# Patient Record
Sex: Female | Born: 1990 | Race: White | Hispanic: Yes | Marital: Single | Smoking: Never smoker
Health system: Southern US, Community
[De-identification: ages and names within clinical notes are randomized; demographics above are authoritative.]

## PROBLEM LIST (undated history)

## (undated) DIAGNOSIS — R87629 Unspecified abnormal cytological findings in specimens from vagina: Secondary | ICD-10-CM

## (undated) DIAGNOSIS — D649 Anemia, unspecified: Secondary | ICD-10-CM

## (undated) HISTORY — DX: Unspecified abnormal cytological findings in specimens from vagina: R87.629

## (undated) HISTORY — DX: Anemia, unspecified: D64.9

---

## 2009-01-10 ENCOUNTER — Observation Stay: Payer: Self-pay

## 2009-03-27 ENCOUNTER — Inpatient Hospital Stay: Payer: Self-pay

## 2012-10-11 ENCOUNTER — Ambulatory Visit: Payer: Self-pay | Admitting: Advanced Practice Midwife

## 2012-10-31 ENCOUNTER — Ambulatory Visit: Payer: Self-pay | Admitting: Physician Assistant

## 2012-11-07 ENCOUNTER — Observation Stay: Payer: Self-pay | Admitting: Obstetrics and Gynecology

## 2012-11-08 ENCOUNTER — Inpatient Hospital Stay: Payer: Self-pay | Admitting: Obstetrics and Gynecology

## 2012-11-08 LAB — CBC WITH DIFFERENTIAL/PLATELET
Basophil %: 0.3 %
Eosinophil #: 0.1 10*3/uL (ref 0.0–0.7)
Eosinophil %: 0.7 %
HCT: 34 % — ABNORMAL LOW (ref 35.0–47.0)
HGB: 11.1 g/dL — ABNORMAL LOW (ref 12.0–16.0)
Lymphocyte #: 2.2 10*3/uL (ref 1.0–3.6)
Lymphocyte %: 19.9 %
MCH: 26.2 pg (ref 26.0–34.0)
MCV: 80 fL (ref 80–100)
Monocyte #: 0.8 x10 3/mm (ref 0.2–0.9)
Neutrophil %: 72.1 %
Platelet: 274 10*3/uL (ref 150–440)
RBC: 4.25 10*6/uL (ref 3.80–5.20)
RDW: 15.9 % — ABNORMAL HIGH (ref 11.5–14.5)
WBC: 10.8 10*3/uL (ref 3.6–11.0)

## 2012-11-08 LAB — GC/CHLAMYDIA PROBE AMP

## 2012-11-09 LAB — HEMATOCRIT: HCT: 29.3 % — ABNORMAL LOW (ref 35.0–47.0)

## 2014-08-23 IMAGING — US US OB US >=[ID] SNGL FETUS
1 series · 14 of 28 positions shown · non-contrast
Comparison: none

REASON FOR EXAM: 34 weeks  low risk anatomy
COMMENTS:

[Series 1: us ob us >=(id) sngl fetus · 0.25mm/px · 14 of 94 slices shown]
[im 4/94]
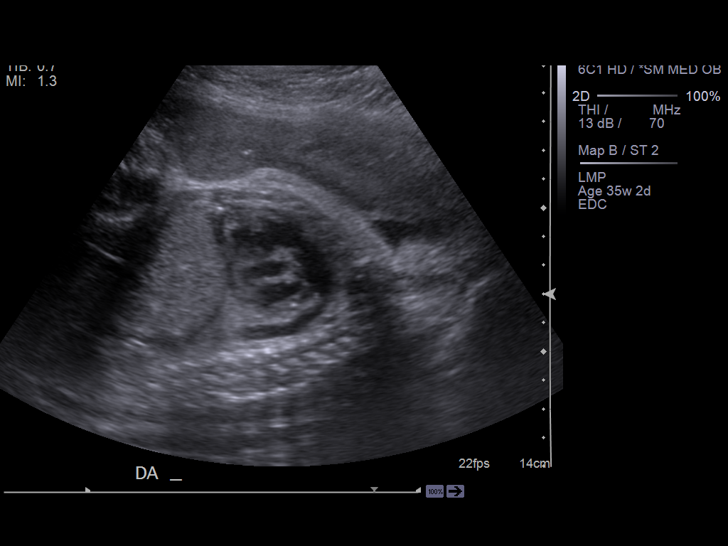
[im 11/94]
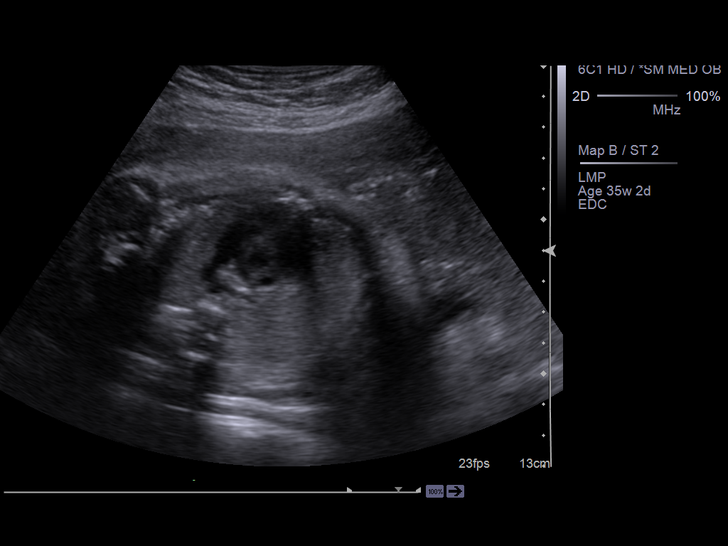
[im 18/94]
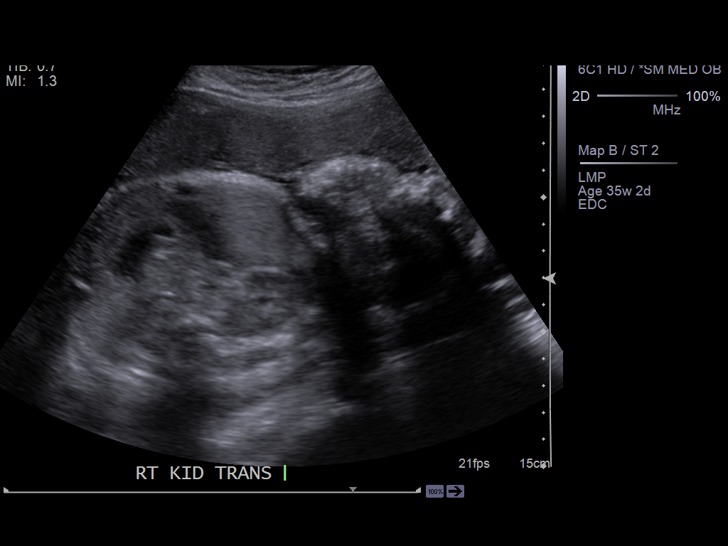
[im 25/94]
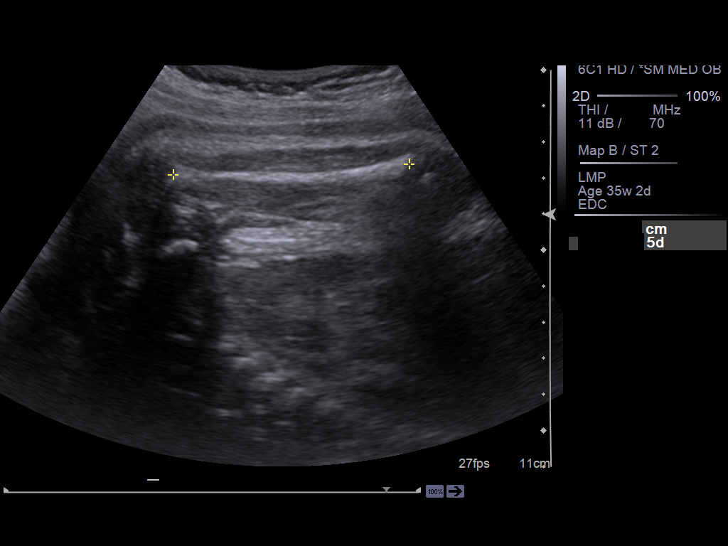
[im 32/94]
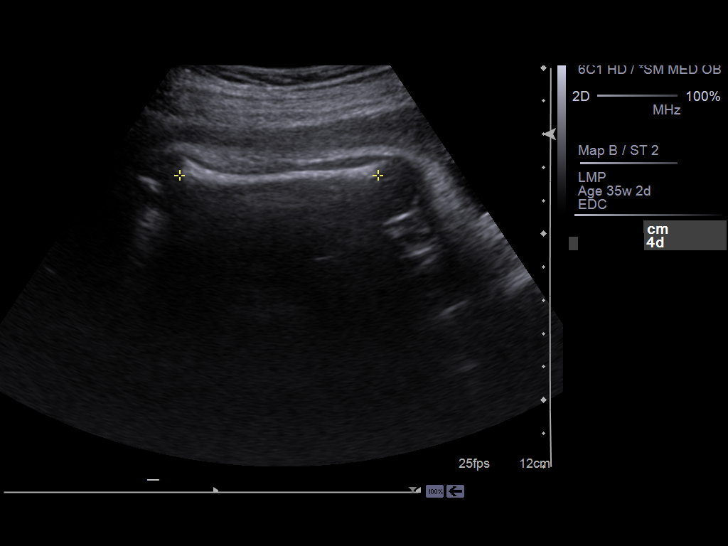
[im 38/94]
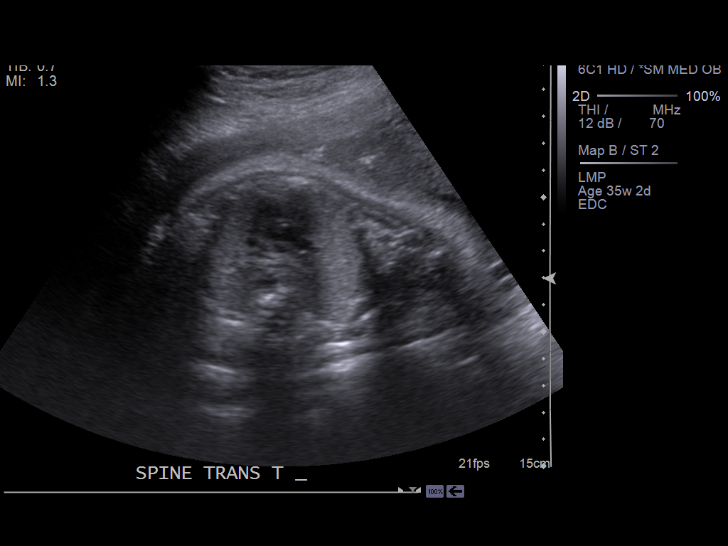
[im 45/94]
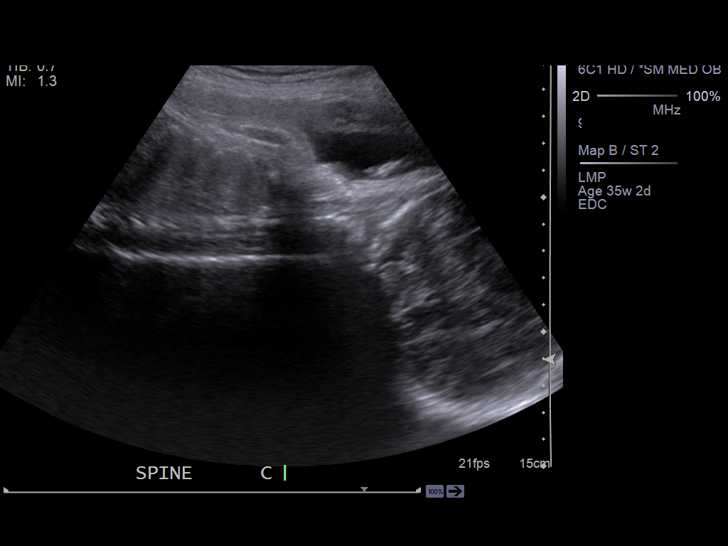
[im 52/94]
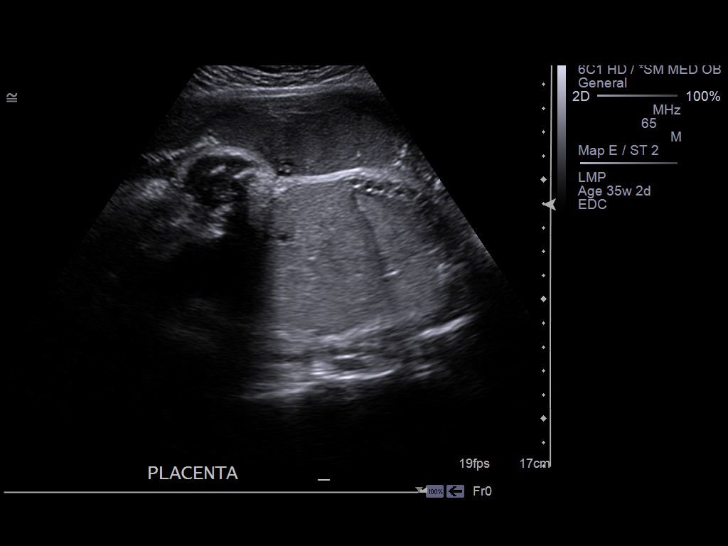
[im 59/94]
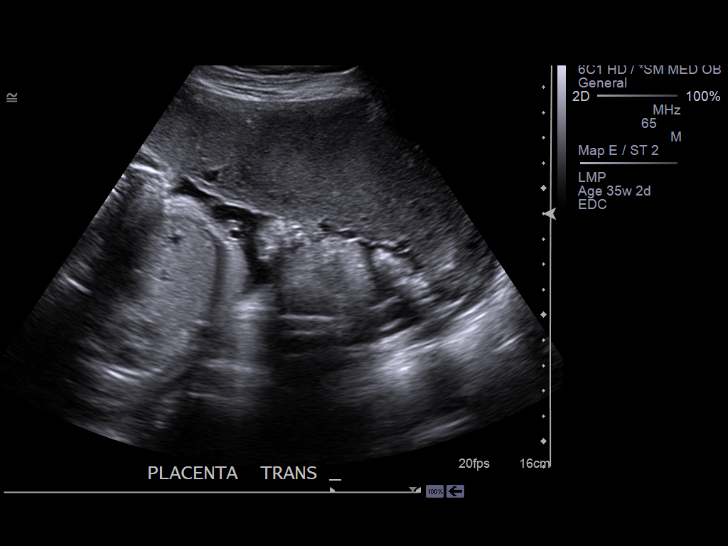
[im 66/94]
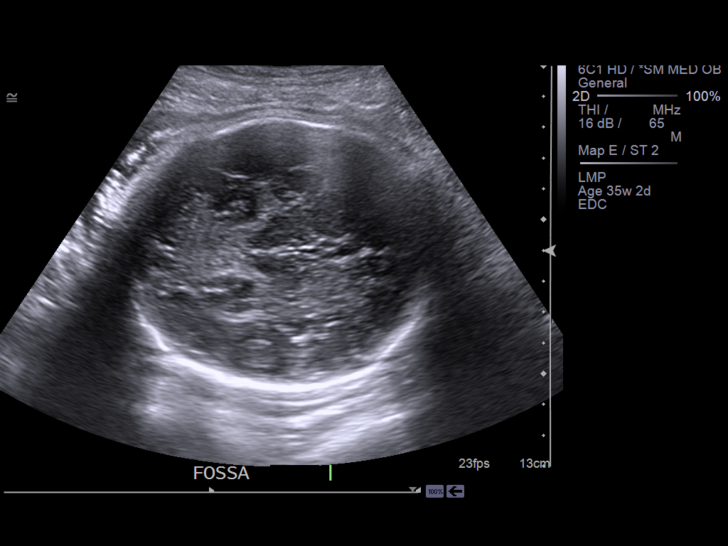
[im 73/94]
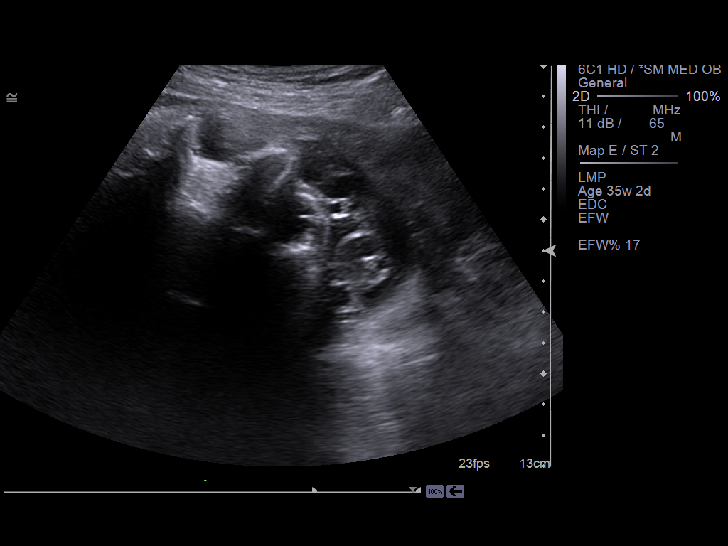
[im 80/94]
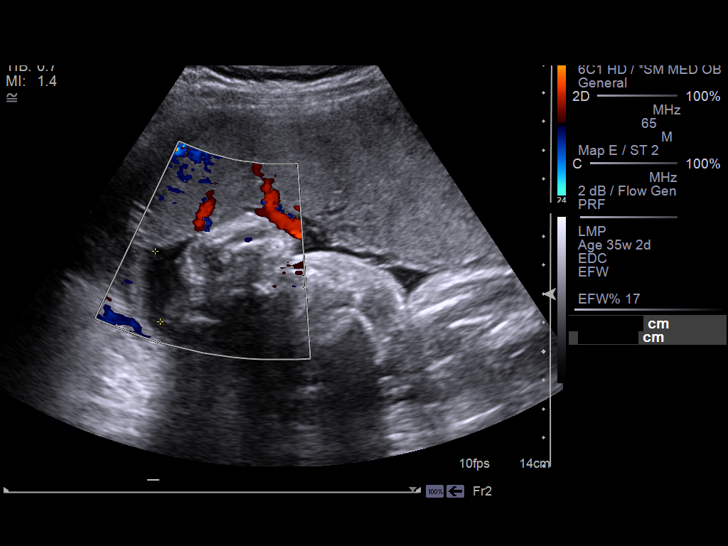
[im 87/94]
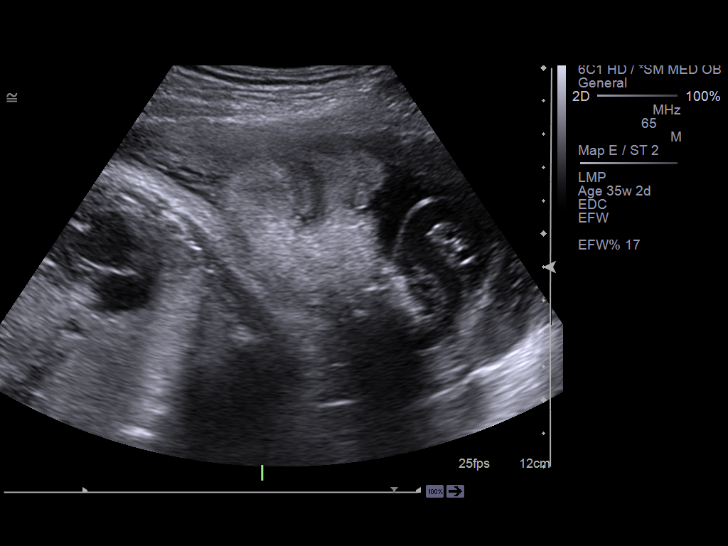
[im 94/94]
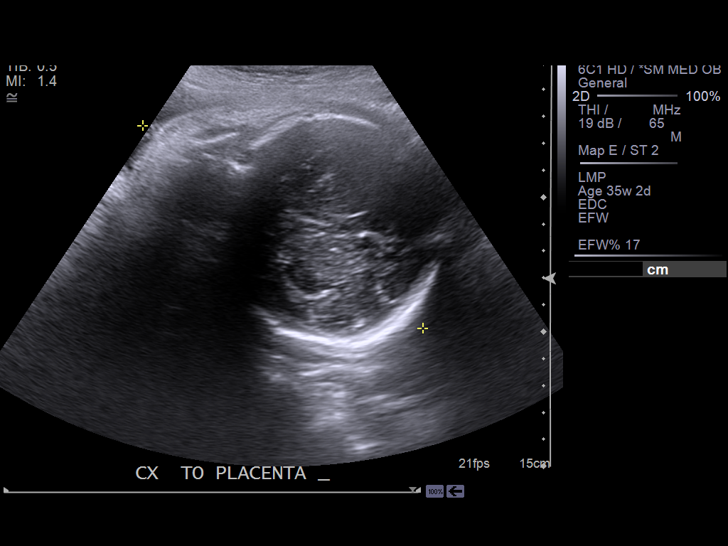

[14 of 28 positions shown; findings below may reference images not displayed]

PROCEDURE:     US  - US OB GREATER/OR EQUAL TO [id number]  - October 11, 2012  [DATE]

RESULT:     Findings: A single living intrauterine gestation is observed.
Presentation currently is cephalic. The cervix is closed measuring 3.56 in
length. The placenta is anterior measuring 12.85 cm from the cervical os.
The fetal heart rate was monitored at 140 beats per minute. The AFI measures
11.2.

A normal appearing three vessel cord is seen. No hydrocephalus or
hydronephrosis is noted.  The bladder, kidneys, stomach, heart, diaphragm,
spine and ventricles demonstrate no focal abnormality. A 4 chambered heart
is visualized. The RVOT and LVOT are suboptimally assessed.

Fetal measurements are as follows:

BPD 8.68 cm 35 weeks 0 days
HC   32.2 cm 36 weeks 3 days
AC   29.77 cm 33 weeks 5 days
FL    6.57 cm 33 weeks 6 days

EFW equal 2471 grams. Average ultrasound age is 34 weeks 5 days. Ultrasound
EDD is 11/17/2012.
IMPRESSION: See above.

[REDACTED]

## 2014-09-12 IMAGING — US US OB FOLLOW-UP
1 series · 14 of 28 positions shown · non-contrast
Comparison: none

REASON FOR EXAM: size less than dates
COMMENTS:

PROCEDURE:     US  - US OB FOLLOW UP  - October 31, 2012  [DATE]
RESULT:     Comparison: 10/11/2012
TECHNIQUE: Multiple grayscale and color Doppler images were obtained of the
pelvis.

[Series 1: us ob follow-up · 0.25mm/px · 14 of 49 slices shown]
[im 2/49]
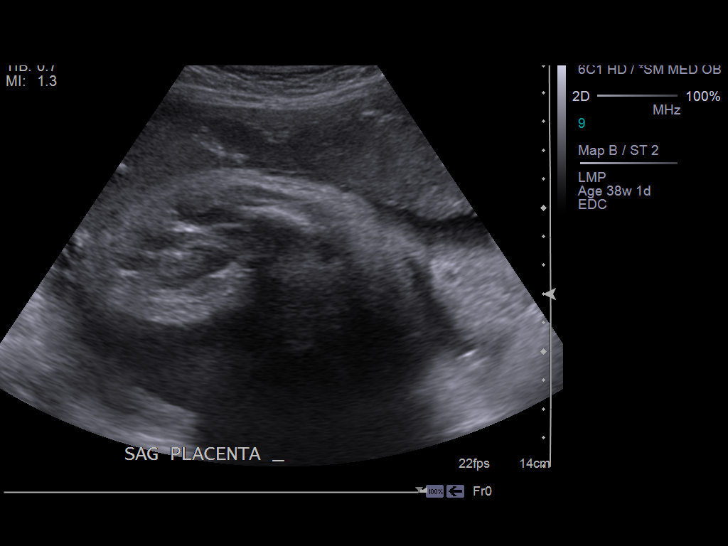
[im 6/49]
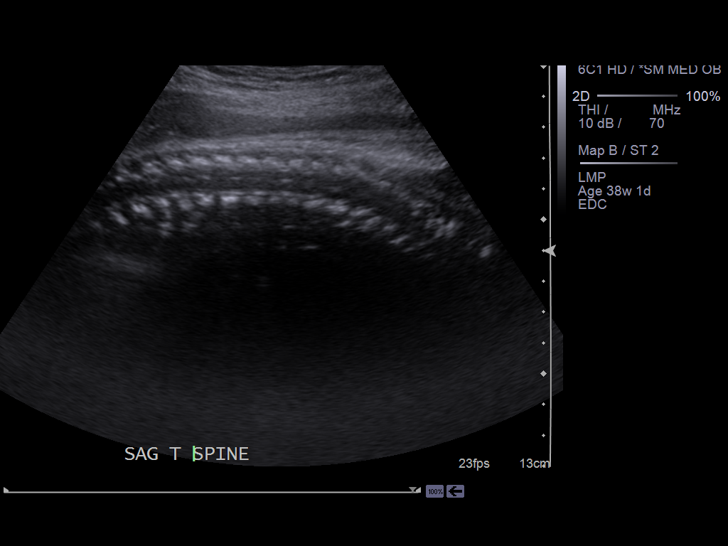
[im 9/49]
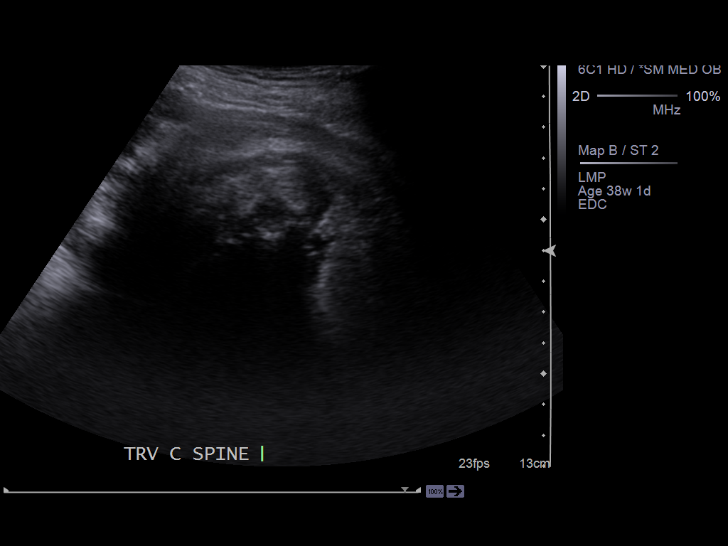
[im 13/49]
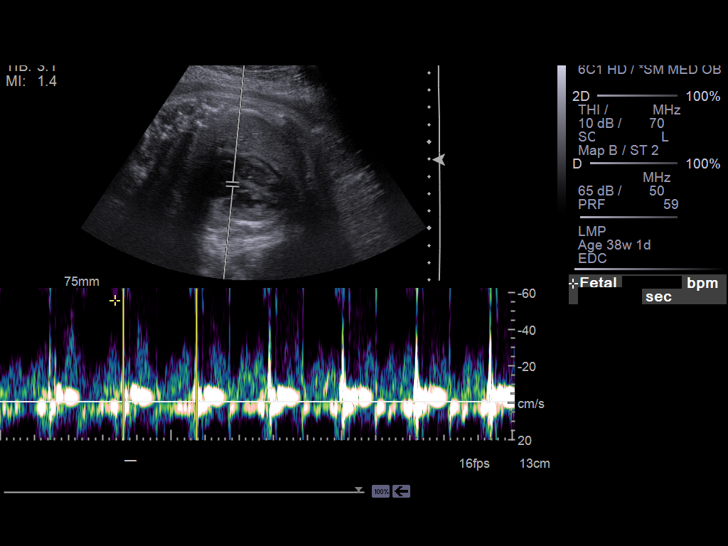
[im 17/49]
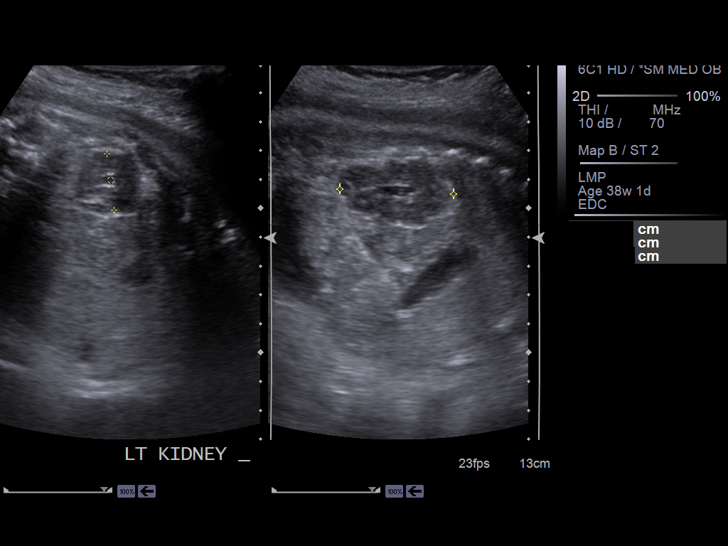
[im 20/49]
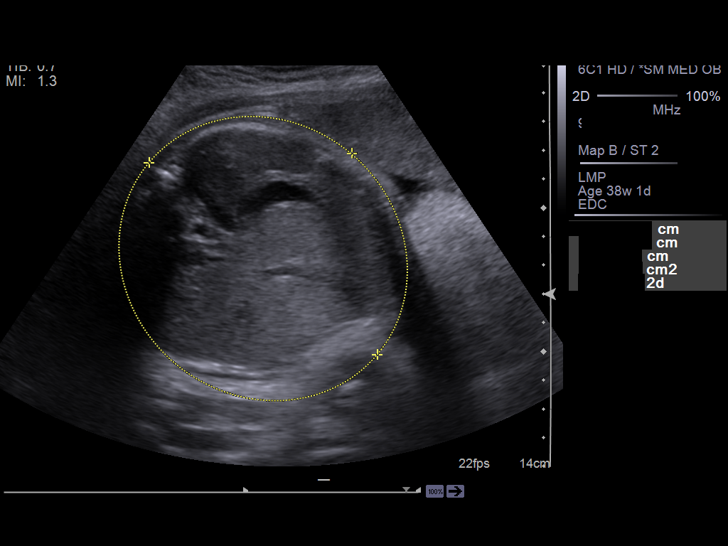
[im 24/49]
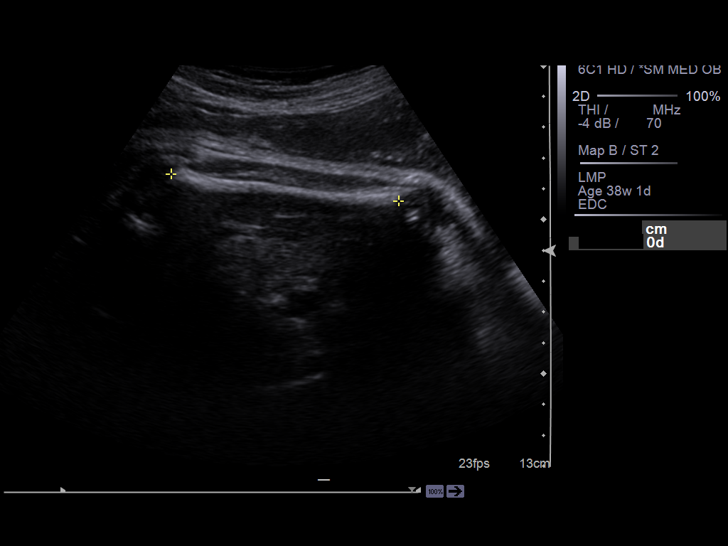
[im 27/49]
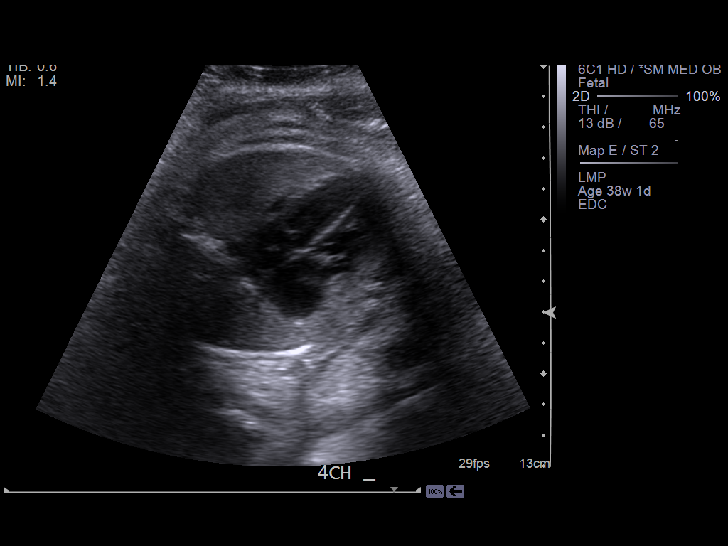
[im 31/49]
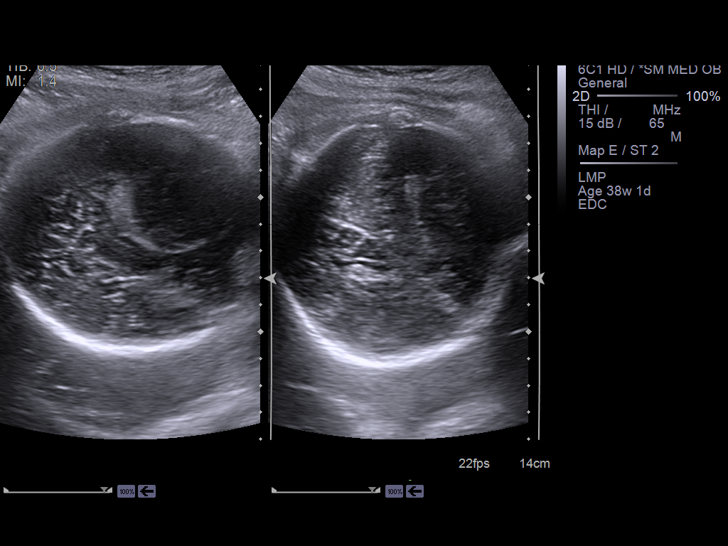
[im 34/49]
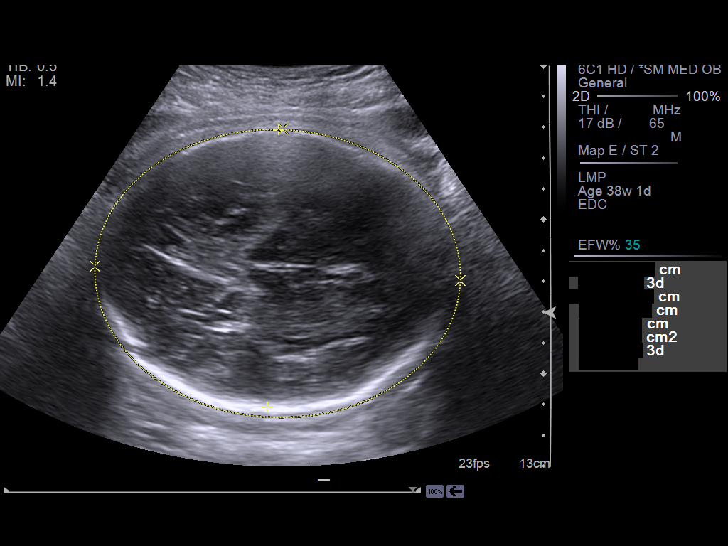
[im 38/49]
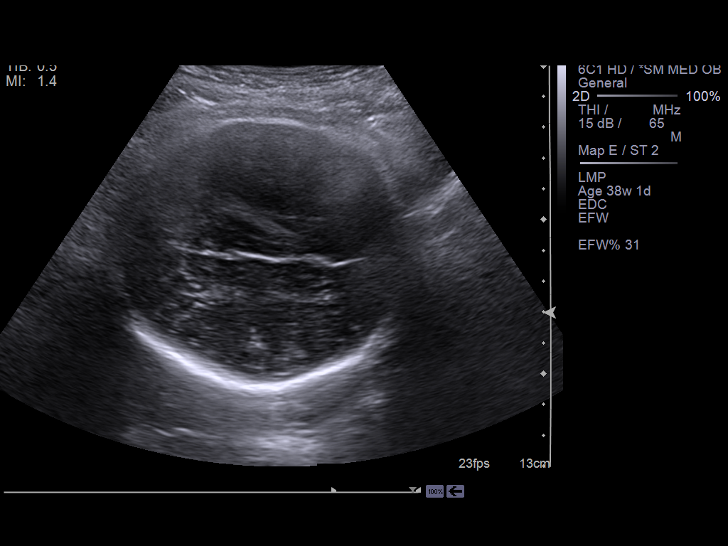
[im 41/49]
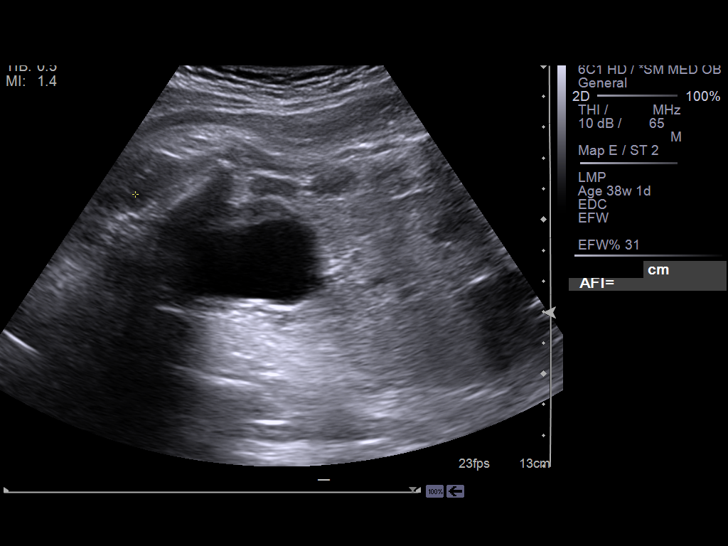
[im 45/49]
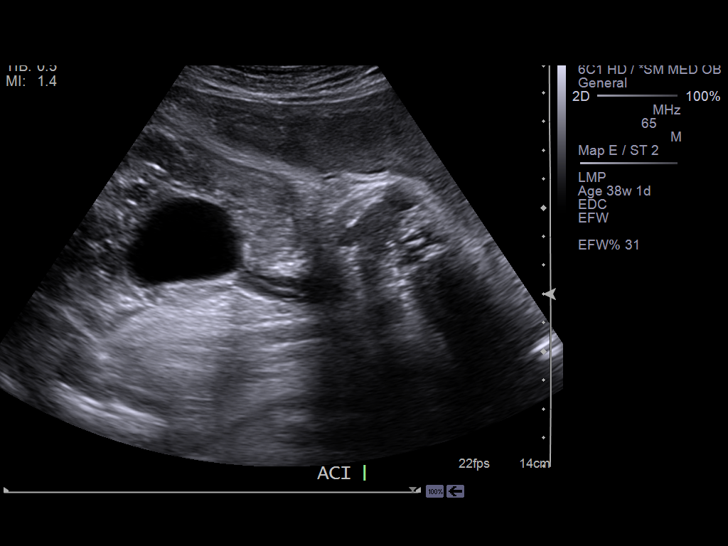
[im 49/49]
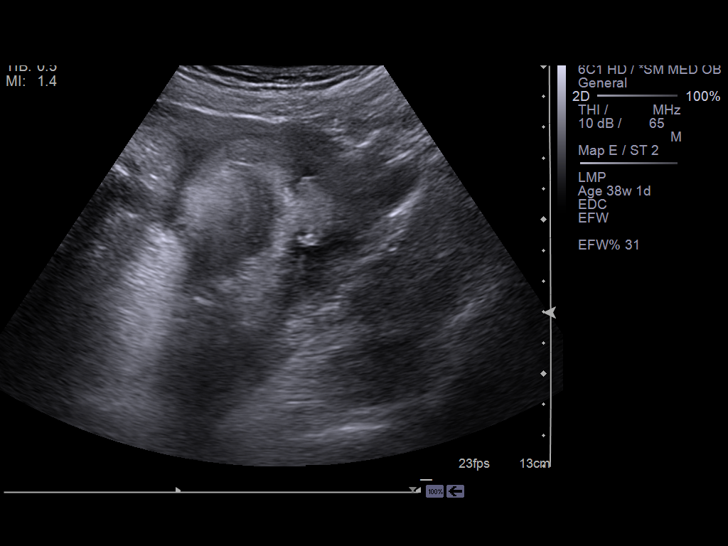

[14 of 28 positions shown; findings below may reference images not displayed]

FINDINGS: There is a single live intrauterine pregnancy. Fetal heart rate was 140
beats per minute. The fetus is currently in cephalic position. Estimated
gestational age is 36 weeks 6 days. This study is 2 weeks since the prior
ultrasound when the estimated gestational age was 34 weeks 5 days. Estimated
fetal weight is 6 pounds 12 ounces, which correlates with estimated fetal
weight percentage of 31%. The amniotic fluid index is 9.1 cm, which is
within normal limits. Please note, this is not a detailed fetal anatomic
study.
IMPRESSION: Single live intrauterine pregnancy with estimated gestational age of 36
weeks 6 days.

[REDACTED]

## 2019-05-02 NOTE — L&D Delivery Note (Signed)
Delivery Note At 6:25 AM a viable and healthy female "Meghan Reynolds" was delivered via Vaginal, Spontaneous (Presentation: Right Occiput Anterior).  APGAR: 8, 9; weight pending .   Placenta status: Spontaneous, Intact.  Cord: 3 vessels with the following complications: None.   Anesthesia: None Episiotomy: None Lacerations: None Est. Blood Loss (mL): 200  Mom to postpartum.  Baby to Couplet care / Skin to Skin.  29yo [id number] at 39+0wks presented inactive labor, delivered without pain meds easily over an intact perineum. No shoulder, baby placed on maternal abdomen. Trailing membranes as placenta delivered, and fundus firm. No manual evacuation of uterus, but no tearing and not bleeding. Speculum placed to evaluate cervix, which appeared hemostatic.   Christeen Douglas 01/07/2020, 7:13 AM

## 2019-11-14 ENCOUNTER — Ambulatory Visit: Payer: Self-pay | Admitting: Family Medicine

## 2019-11-14 ENCOUNTER — Other Ambulatory Visit: Payer: Self-pay | Admitting: Family Medicine

## 2019-11-14 ENCOUNTER — Encounter: Payer: Self-pay | Admitting: Family Medicine

## 2019-11-14 ENCOUNTER — Other Ambulatory Visit: Payer: Self-pay

## 2019-11-14 VITALS — BP 95/57 | HR 95 | Temp 97.9°F | Ht 67.0 in | Wt 167.6 lb

## 2019-11-14 DIAGNOSIS — Z3480 Encounter for supervision of other normal pregnancy, unspecified trimester: Secondary | ICD-10-CM | POA: Diagnosis not present

## 2019-11-14 DIAGNOSIS — K219 Gastro-esophageal reflux disease without esophagitis: Secondary | ICD-10-CM | POA: Insufficient documentation

## 2019-11-14 DIAGNOSIS — O0930 Supervision of pregnancy with insufficient antenatal care, unspecified trimester: Secondary | ICD-10-CM

## 2019-11-14 DIAGNOSIS — Z3483 Encounter for supervision of other normal pregnancy, third trimester: Secondary | ICD-10-CM

## 2019-11-14 HISTORY — DX: Gastro-esophageal reflux disease without esophagitis: K21.9

## 2019-11-14 LAB — URINALYSIS
Bilirubin, UA: NEGATIVE
Glucose, UA: NEGATIVE
Ketones, UA: NEGATIVE
Leukocytes,UA: NEGATIVE
Nitrite, UA: NEGATIVE
Protein,UA: NEGATIVE
RBC, UA: NEGATIVE
Specific Gravity, UA: 1.025 (ref 1.005–1.030)
Urobilinogen, Ur: 0.2 mg/dL (ref 0.2–1.0)
pH, UA: 6.5 (ref 5.0–7.5)

## 2019-11-14 LAB — HEMOGLOBIN, FINGERSTICK: Hemoglobin: 11.8 g/dL (ref 11.1–15.9)

## 2019-11-14 LAB — WET PREP FOR TRICH, YEAST, CLUE
Trichomonas Exam: NEGATIVE
Yeast Exam: NEGATIVE

## 2019-11-14 NOTE — Progress Notes (Signed)
Here today for 31.0 week MH IP. Transfer of Prenatal Care from [place]. Taking PNV QD. Denies ED/hospital visits since arrival to [place] 11/09/19. Tawny Hopping, RN

## 2019-11-14 NOTE — Progress Notes (Addendum)
[employer] results reviewed. Per standing orders no treatment indicated. PTL and Kick Count information included in today's education. Tawny Hopping, RN

## 2019-11-14 NOTE — Progress Notes (Signed)
[facility] HEALTH DEPT [facility] [address] [place] [address] [phone]  INITIAL PRENATAL VISIT NOTE  Subjective:  Meghan Reynolds is a 29 y.o. G3P2002 at [redacted]w[redacted]d being seen today to start prenatal care at the [facility] Department.  She is currently monitored for the following issues for this low-risk pregnancy and has Supervision of other normal pregnancy, antepartum; Transfer of OB care from [place] at 31 wks; and Gastroesophageal reflux disease on their problem list.  Pt presents for initial OB visit in the [place]. She received prenatal care in [place], brings along [place] reports (though no anatomy US report), no bloodwork results. EDD is based on [place] from Jan 27/2021 at 7 weeks. She is temporarily back in [place] to have her child, living with her parents, working part time on a car lot. Her partner remains in [place] with their other 2 children, ages 72 and 44. She is having heartburn but otherwise feeling well. Had some spotting ~1 mo ago in [place], advised to rest for a few days, no bleeding since. No other complications with this pregnancy. She denies chronic medical issues, does not drink, smoke or use drugs. Her last pap was in 2017, result normal. Her prior 2 pregnancies were uncomplicated: 03/2009 = SVD at [redacted]w[redacted]d, 6+ lbs. 10/2012 = SVD at [redacted]w[redacted]d, 7+ lbs. Denies HTN, GDM or other problems during past pregnancies. Denies family history of genetic abnormalities. PHQ-9 results: 3, states she feels very emotional during this pregnancy, declines beh health referral for now.    Contractions: Not present. Vag. Bleeding: None.  Movement: Present. Denies leaking of fluid.   Indications for ASA therapy (per uptodate) One of the following: Previous pregnancy with preeclampsia, especially early onset and with an adverse outcome No Multifetal gestation No Chronic hypertension No Type 1 or 2 diabetes mellitus No Chronic kidney disease No Autoimmune  disease (antiphospholipid syndrome, systemic lupus erythematosus) No  Two or more of the following: Nulliparity No Obesity (body mass index >30 kg/m2) No Family history of preeclampsia in mother or sister No Age ?35 years No Sociodemographic characteristics (African American race, low socioeconomic level) Yes Personal risk factors (eg, previous pregnancy with low birth weight or small for gestational age infant, previous adverse pregnancy outcome [eg, stillbirth], interval >10 years between pregnancies) No   The following portions of the patient's history were reviewed and updated as appropriate: allergies, current medications, past family history, past medical history, past social history, past surgical history and problem list. Problem list updated.  Objective:   Vitals:   11/14/19 1320 11/14/19 1321 11/14/19 1500  BP: (!) 95/57  (!) 95/57  Pulse: 95  95  Temp: 97.9 F (36.6 C)  97.9 F (36.6 C)  Weight: 167 lb 9.6 oz (76 kg)  167 lb 9 oz (76 kg)  Height:  5\' 7"  (1.702 m)     Fetal Status: Fetal Heart Rate (bpm): 150 Fundal Height: 29 cm Movement: Present      Physical Exam Vitals and nursing note reviewed.  Constitutional:      General: She is not in acute distress.    Appearance: Normal appearance. She is well-developed.  HENT:     Head: Normocephalic and atraumatic.     Right Ear: External ear normal.     Left Ear: External ear normal.     Nose: Nose normal. No congestion or rhinorrhea.     Mouth/Throat:     Lips: Pink.     Mouth: Mucous  membranes are moist.     Dentition: Normal dentition. No dental caries.     Pharynx: Oropharynx is clear. Uvula midline.  Eyes:     General: No scleral icterus.    Conjunctiva/sclera: Conjunctivae normal.  Neck:     Thyroid: No thyroid mass or thyromegaly.  Cardiovascular:     Rate and Rhythm: Normal rate.     Pulses: Normal pulses.     Comments: Extremities are warm and well perfused Pulmonary:     Effort: Pulmonary effort  is normal.     Breath sounds: Normal breath sounds.  Chest:     Breasts: Breasts are symmetrical.        Right: Normal. No mass, nipple discharge or skin change.        Left: Normal. No mass, nipple discharge or skin change.  Abdominal:     General: Abdomen is flat.     Palpations: Abdomen is soft.     Tenderness: There is no abdominal tenderness.     Comments: Gravid   Genitourinary:    General: Normal vulva.     Exam position: Lithotomy position.     Pubic Area: No rash.      Labia:        Right: No rash.        Left: No rash.      Vagina: Vaginal discharge (minimal, white, curdlike) present.     Cervix: No cervical motion tenderness or friability.     Uterus: Normal. Enlarged (Gravid 29 wk size ). Not tender.      Adnexa: Right adnexa normal and left adnexa normal.     Rectum: Normal. No external hemorrhoid.  Musculoskeletal:     Right lower leg: No edema.     Left lower leg: No edema.  Lymphadenopathy:     Upper Body:     Right upper body: No axillary adenopathy.     Left upper body: No axillary adenopathy.  Skin:    General: Skin is warm.     Capillary Refill: Capillary refill takes less than 2 seconds.  Neurological:     Mental Status: She is alert.     Assessment and Plan:  Pregnancy: G3P2002 at [redacted]w[redacted]d   1. Supervision of other normal pregnancy, antepartum -Initial prenatal visit in [place] today, had prior care in [place] though only records are brief [place] reports. Will get labs today and refer for anatomy US.  -PHQ-9 score is 3. Pt informed of counselor available if desired at any time. Declines for now -Reviewed risk factors and aspirin is not indicated -Pap done today -Covid vaccine discussed, pt is undecided, will call clinic if desires.  - Lead, blood (adult age 24 yrs or greater) - Glucose, 1 hour gestational - HIV Antibody (routine testing w rflx) - Prenatal profile without Varicella/Rubella ([id number]) - Urine Culture - Chlamydia/GC NAA, Confirmation -  QuantiFERON-TB Gold Plus - Tdap vaccine greater than or equal to 7yo IM - WET PREP FOR TRICH, YEAST, CLUE - Hemoglobin, venipuncture - Urinalysis (Urine Dip) - IGP, rfx Aptima HPV ASCU  2. Transferred prenatal care -Transferring OB care from [place] at 31 wks  3. Gastroesophageal reflux disease, unspecified whether esophagitis present -Advised eating small meals and avoiding common trigger foods/drinks, elevating head at nighttime, not reclining immediately after eating, and using Tums as needed. If that fails advised trial of famotidine (Pepcid) 10mg  twice per day. Handout with information given as well.    Discussed overview of care and coordination with inpatient delivery  practices including [facility], [facility] and [facility].     Preterm labor symptoms and general obstetric precautions including but not limited to vaginal bleeding, contractions, leaking of fluid and fetal movement were reviewed in detail with the patient.  Please refer to After Visit Summary for other counseling recommendations.   No follow-ups on file.  No future appointments.  Ann Held, PA-C

## 2019-11-15 LAB — HCV INTERPRETATION

## 2019-11-15 LAB — HCV AB W/RFLX TO VERIFICATION: HCV Ab: <0.1 s/co ratio (ref 0.0–0.9)

## 2019-11-16 LAB — URINE CULTURE

## 2019-11-17 LAB — IGP, RFX APTIMA HPV ASCU: PAP Smear Comment: 0

## 2019-11-17 LAB — LEAD, BLOOD (ADULT >= 16 YRS): Lead-Whole Blood: 1 ug/dL (ref 0–4)

## 2019-11-18 ENCOUNTER — Telehealth: Payer: Self-pay

## 2019-11-18 LAB — CBC/D/PLT+RPR+RH+ABO+AB SCR
Antibody Screen: NEGATIVE
Basophils Absolute: 0 10*3/uL (ref 0.0–0.2)
Basos: 0 %
EOS (ABSOLUTE): 0.1 10*3/uL (ref 0.0–0.4)
Eos: 1 %
Hematocrit: 35.1 % (ref 34.0–46.6)
Hemoglobin: 11.8 g/dL (ref 11.1–15.9)
Hepatitis B Surface Ag: NEGATIVE
Immature Grans (Abs): 0.1 10*3/uL (ref 0.0–0.1)
Immature Granulocytes: 1 %
Lymphocytes Absolute: 1.8 10*3/uL (ref 0.7–3.1)
Lymphs: 20 %
MCH: 28.9 pg (ref 26.6–33.0)
MCHC: 33.6 g/dL (ref 31.5–35.7)
MCV: 86 fL (ref 79–97)
Monocytes Absolute: 0.6 10*3/uL (ref 0.1–0.9)
Monocytes: 6 %
Neutrophils Absolute: 6.5 10*3/uL (ref 1.4–7.0)
Neutrophils: 72 %
Platelets: 302 10*3/uL (ref 150–450)
RBC: 4.08 x10E6/uL (ref 3.77–5.28)
RDW: 13.3 % (ref 11.7–15.4)
RPR Ser Ql: NONREACTIVE
Rh Factor: POSITIVE
WBC: 9.1 10*3/uL (ref 3.4–10.8)

## 2019-11-18 LAB — QUANTIFERON-TB GOLD PLUS
QuantiFERON Mitogen Value: 7.74 IU/mL
QuantiFERON Nil Value: 0.02 IU/mL
QuantiFERON TB1 Ag Value: 0.03 IU/mL
QuantiFERON TB2 Ag Value: 0.02 IU/mL
QuantiFERON-TB Gold Plus: NEGATIVE

## 2019-11-18 LAB — GLUCOSE, 1 HOUR GESTATIONAL: Gestational Diabetes Screen: 92 mg/dL (ref 65–139)

## 2019-11-18 LAB — HIV ANTIBODY (ROUTINE TESTING W REFLEX): HIV Screen 4th Generation wRfx: NONREACTIVE

## 2019-11-18 NOTE — Telephone Encounter (Signed)
Verified client aware of [facility] 11/21/2019 2:30 pm OB US appt with arrival time of 2:-00 pm with full bladder. Client states was notified yesterday of appt. Jossie Ng, RN

## 2019-11-19 LAB — CHLAMYDIA/GC NAA, CONFIRMATION
Chlamydia trachomatis, NAA: NEGATIVE
Neisseria gonorrhoeae, NAA: NEGATIVE

## 2019-11-21 ENCOUNTER — Ambulatory Visit
Admission: RE | Admit: 2019-11-21 | Discharge: 2019-11-21 | Disposition: A | Discharge status: Home / Self Care | Payer: Medicaid Other | Source: Ambulatory Visit | Attending: Family Medicine | Admitting: Family Medicine

## 2019-11-21 ENCOUNTER — Other Ambulatory Visit: Payer: Self-pay

## 2019-11-21 DIAGNOSIS — Z3483 Encounter for supervision of other normal pregnancy, third trimester: Secondary | ICD-10-CM | POA: Diagnosis present

## 2019-11-25 NOTE — Addendum Note (Signed)
Addended by: Farzad Tibbetts on: 11/25/2019 02:28 PM   Modules accepted: Orders  

## 2019-11-28 ENCOUNTER — Ambulatory Visit: Payer: Self-pay | Admitting: Advanced Practice Midwife

## 2019-11-28 ENCOUNTER — Other Ambulatory Visit: Payer: Self-pay

## 2019-11-28 VITALS — BP 99/62 | HR 92 | Temp 97.1°F | Wt 167.8 lb

## 2019-11-28 DIAGNOSIS — Z3480 Encounter for supervision of other normal pregnancy, unspecified trimester: Secondary | ICD-10-CM

## 2019-11-28 MED ORDER — PRENATAL VITAMIN 27-0.8 MG PO TABS: 1.0000 | ORAL_TABLET | Freq: Every day | ORAL | 0 refills | Status: AC

## 2019-11-28 NOTE — Progress Notes (Signed)
   PRENATAL VISIT NOTE  Subjective:  Meghan Reynolds is a 29 y.o. G3P2002 at [redacted]w[redacted]d being seen today for ongoing prenatal care.  She is currently monitored for the following issues for this low-risk pregnancy and has Supervision of other normal pregnancy, antepartum; Transfer of OB care from [place] at 31 wks; and Gastroesophageal reflux disease on their problem list.  Patient reports no complaints.  Contractions: Not present. Vag. Bleeding: None.  Movement: Present. Denies leaking of fluid/ROM.   The following portions of the patient's history were reviewed and updated as appropriate: allergies, current medications, past family history, past medical history, past social history, past surgical history and problem list. Problem list updated.  Objective:   Vitals:   11/28/19 0819  BP: (!) 99/62  Pulse: 92  Temp: (!) 97.1 F (36.2 C)  Weight: 167 lb 12.8 oz (76.1 kg)    Fetal Status:   Fundal Height: 31 cm Movement: Present     General:  Alert, oriented and cooperative. Patient is in no acute distress.  Skin: Skin is warm and dry. No rash noted.   Cardiovascular: Normal heart rate noted  Respiratory: Normal respiratory effort, no problems with respiration noted  Abdomen: Soft, gravid, appropriate for gestational age.  Pain/Pressure: Absent     Pelvic: Cervical exam deferred        Extremities: Normal range of motion.  Edema: None  Mental Status: Normal mood and affect. Normal behavior. Normal judgment and thought content.   Assessment and Plan:  Pregnancy: G3P2002 at [redacted]w[redacted]d  1. Supervision of other normal pregnancy, antepartum Feels well.  Reviewed 11/21/19 u/s at 32 3/7 with posterior placenta, vtx, EFW=30% (1880) anatomy wnl, AFI wnl.  Came to [place] on 11/09/19.  Living with her parents and 16 yo brother.  No weight gain in past week.  17 lb 12.8 oz (8.074 kg) Counseled pt to eat high protein snacks q 2-3 hours.  Denies N&V, diarrhea   Preterm labor symptoms and general obstetric  precautions including but not limited to vaginal bleeding, contractions, leaking of fluid and fetal movement were reviewed in detail with the patient. Please refer to After Visit Summary for other counseling recommendations.  No follow-ups on file.  No future appointments.  Alberteen Spindle, CNM

## 2019-11-28 NOTE — Addendum Note (Signed)
Addended by: Burt Knack on: 11/28/2019 09:16 AM   Modules accepted: Orders

## 2019-11-28 NOTE — Progress Notes (Signed)
PNV given today.Burt Knack, RN

## 2019-11-28 NOTE — Progress Notes (Signed)
Patient here for MH RV at 33 2/7. Needs more PNV today. Burt Knack, RN

## 2019-12-05 ENCOUNTER — Other Ambulatory Visit: Payer: Self-pay

## 2019-12-05 ENCOUNTER — Ambulatory Visit: Payer: Medicaid Other | Admitting: Family Medicine

## 2019-12-05 VITALS — BP 105/67 | HR 97 | Temp 97.1°F | Wt 170.4 lb

## 2019-12-05 DIAGNOSIS — Z3480 Encounter for supervision of other normal pregnancy, unspecified trimester: Secondary | ICD-10-CM

## 2019-12-05 DIAGNOSIS — O0930 Supervision of pregnancy with insufficient antenatal care, unspecified trimester: Secondary | ICD-10-CM

## 2019-12-05 NOTE — Progress Notes (Signed)
Here today 34.2 weeks MH RV. Taking PNV QD. Denies ED/hospital visits since last RV. Tawny Hopping, RN

## 2019-12-05 NOTE — Progress Notes (Signed)
Reviewed typical weight gain guidelines; discussed 2-4 lbs/ month avg.; discussed labs needed at next visit; denies problems Sharlette Dense, RN

## 2019-12-05 NOTE — Progress Notes (Signed)
Attestation of Attending Supervision of Enhanced Role Nurse:  At public health departments, Enhanced Role Nurses work under standing orders and procedures to provide maternity related screening services.. I agree with the care provided to this patient and was available for any consultation as needed.  I have reviewed the RN's note and chart.  I was not consulted by the RN for the patient. If consulted documentation of consult is reflected in the RN's documentation.   Federico Flake, MD, MPH, ABFM Medical Director  [facility].

## 2019-12-11 ENCOUNTER — Telehealth: Payer: Self-pay

## 2019-12-11 NOTE — Telephone Encounter (Signed)
Call from client-reports first noticed 2 days ago-when showering and cleaning perineum felt "something"-describes large swollen area on perineum-denies pain with pressure; no abd. Pain; no bleeding/spotting or ROM; & is having good FM; consulted A. Streilein, [place] & reassured is probably varicosities but needs to get a mirror and inspect area or have partner check; if develops pain or other s/s to call back-pt. Agrees Sharlette Dense, RN

## 2019-12-11 NOTE — Telephone Encounter (Signed)
Consulted with RN re: patient concern. Agree with note as documented re: plan of care.

## 2019-12-19 ENCOUNTER — Other Ambulatory Visit: Payer: Self-pay | Admitting: Family Medicine

## 2019-12-19 ENCOUNTER — Ambulatory Visit: Payer: Medicaid Other | Admitting: Advanced Practice Midwife

## 2019-12-19 ENCOUNTER — Other Ambulatory Visit: Payer: Self-pay

## 2019-12-19 VITALS — BP 85/62 | HR 121 | Temp 97.1°F | Wt 172.6 lb

## 2019-12-19 DIAGNOSIS — Z3480 Encounter for supervision of other normal pregnancy, unspecified trimester: Secondary | ICD-10-CM

## 2019-12-19 NOTE — Progress Notes (Signed)
   PRENATAL VISIT NOTE  Subjective:  Meghan Reynolds is a 29 y.o. G3P2002 at [redacted]w[redacted]d being seen today for ongoing prenatal care.  She is currently monitored for the following issues for this low-risk pregnancy and has Supervision of other normal pregnancy, antepartum; Transfer of OB care from [place] at 31 wks; and Gastroesophageal reflux disease on their problem list.  Patient reports swollen lump in vagina that feels heavy; onset 1 week ago.  Contractions: Not present. Vag. Bleeding: None.  Movement: Present. Denies leaking of fluid/ROM.   The following portions of the patient's history were reviewed and updated as appropriate: allergies, current medications, past family history, past medical history, past social history, past surgical history and problem list. Problem list updated.  Objective:   Vitals:   12/19/19 0821  BP: (!) 85/62  Pulse: (!) 121  Temp: (!) 97.1 F (36.2 C)  Weight: 172 lb 9.6 oz (78.3 kg)    Fetal Status: Fetal Heart Rate (bpm): 140 Fundal Height: 36 cm Movement: Present     General:  Alert, oriented and cooperative. Patient is in no acute distress.  Skin: Skin is warm and dry. No rash noted.   Cardiovascular: Normal heart rate noted  Respiratory: Normal respiratory effort, no problems with respiration noted  Abdomen: Soft, gravid, appropriate for gestational age.  Pain/Pressure: Absent     Pelvic: Cervical exam deferred        Extremities: Normal range of motion.  Edema: None  Mental Status: Normal mood and affect. Normal behavior. Normal judgment and thought content.   Assessment and Plan:  Pregnancy: G3P2002 at [redacted]w[redacted]d  1. Supervision of other normal pregnancy, antepartum Knows when to go to L&D.  No car seat yet.  Has other things for baby.   GC/Chlamydia/GBS cultures obtained Pt c/o swollen lump in vagina x 1 week that feels heavy--cystocele moderate; suggestions given - Chlamydia/GC NAA, Confirmation - Culture, beta strep (group b only)   Preterm  labor symptoms and general obstetric precautions including but not limited to vaginal bleeding, contractions, leaking of fluid and fetal movement were reviewed in detail with the patient. Please refer to After Visit Summary for other counseling recommendations.  No follow-ups on file.  No future appointments.  Alberteen Spindle, CNM

## 2019-12-19 NOTE — Progress Notes (Signed)
Patient here for MH RV at 35 6/7. 36 week labs and packet today. Patient prefers provider to collect labs.Burt Knack, RN

## 2019-12-23 LAB — CHLAMYDIA/GC NAA, CONFIRMATION
Chlamydia trachomatis, NAA: NEGATIVE
Neisseria gonorrhoeae, NAA: NEGATIVE

## 2019-12-23 LAB — CULTURE, BETA STREP (GROUP B ONLY): Strep Gp B Culture: NEGATIVE

## 2019-12-26 ENCOUNTER — Encounter: Payer: Self-pay | Admitting: Family Medicine

## 2019-12-26 ENCOUNTER — Ambulatory Visit: Payer: Medicaid Other | Admitting: Family Medicine

## 2019-12-26 ENCOUNTER — Other Ambulatory Visit: Payer: Self-pay

## 2019-12-26 DIAGNOSIS — Z3480 Encounter for supervision of other normal pregnancy, unspecified trimester: Secondary | ICD-10-CM

## 2019-12-26 NOTE — Progress Notes (Signed)
   PRENATAL VISIT NOTE  Subjective:  Meghan Reynolds is a 29 y.o. G3P2002 at [redacted]w[redacted]d being seen today for ongoing prenatal care.  She is currently monitored for the following issues for this low-risk pregnancy and has Supervision of other normal pregnancy, antepartum; Transfer of OB care from [place] at 31 wks; and Gastroesophageal reflux disease on their problem list.  Patient reports nipple irritation.  Contractions: Not present. Vag. Bleeding: None.  Movement: Present. Denies leaking of fluid/ROM.   The following portions of the patient's history were reviewed and updated as appropriate: allergies, current medications, past family history, past medical history, past social history, past surgical history and problem list. Problem list updated.  Objective:   Vitals:   12/26/19 0824  BP: 99/63  Pulse: 90  Temp: 98.6 F (37 C)  Weight: 174 lb 12.8 oz (79.3 kg)    Fetal Status: Fetal Heart Rate (bpm): 150 Fundal Height: 37 cm Movement: Present  Presentation: Vertex  General:  Alert, oriented and cooperative. Patient is in no acute distress.  Skin: Skin is warm and dry. No rash noted. Areola and nipple without rash, erythema or cracks.  Cardiovascular: Normal heart rate noted  Respiratory: Normal respiratory effort, no problems with respiration noted  Abdomen: Soft, gravid, appropriate for gestational age.  Pain/Pressure: Absent     Pelvic: Cervical exam deferred        Extremities: Normal range of motion.  Edema: Trace  Mental Status: Normal mood and affect. Normal behavior. Normal judgment and thought content.   Assessment and Plan:  Pregnancy: G3P2002 at [redacted]w[redacted]d    1. Supervision of other normal pregnancy, antepartum -Reviewed 36 wk labs, wnl -Regarding nipple irritation - no signs of infection, advised vasoline (to d/c pp) or lanolin -Reviewed how to get in touch with hospital, discussed when to call. -Ready for baby at home, has carseat. -Has signed up for [facility] and is using this.   -Reviewed breastfeeding support at [facility] and in [facility] clinic.  -Reviewed contraceptive plans and is considering mirena.    Term labor symptoms and general obstetric precautions including but not limited to vaginal bleeding, contractions, leaking of fluid and fetal movement were reviewed in detail with the patient. Please refer to After Visit Summary for other counseling recommendations.  Return in about 1 week (around 01/02/2020) for routine prenatal care.  Future Appointments  Date Time Provider Department Center  01/02/2020 10:00 AM [facility] PROVIDER [facility] None    Ann Held, PA-C

## 2020-01-02 ENCOUNTER — Other Ambulatory Visit: Payer: Self-pay

## 2020-01-02 ENCOUNTER — Ambulatory Visit: Payer: Medicaid Other | Admitting: Family Medicine

## 2020-01-02 ENCOUNTER — Encounter: Payer: Self-pay | Admitting: Family Medicine

## 2020-01-02 VITALS — BP 106/67 | HR 86 | Temp 97.1°F | Wt 176.2 lb

## 2020-01-02 DIAGNOSIS — Z3480 Encounter for supervision of other normal pregnancy, unspecified trimester: Secondary | ICD-10-CM

## 2020-01-02 DIAGNOSIS — O0930 Supervision of pregnancy with insufficient antenatal care, unspecified trimester: Secondary | ICD-10-CM

## 2020-01-02 NOTE — Progress Notes (Signed)
   PRENATAL VISIT NOTE  Subjective:  Meghan Reynolds is a 29 y.o. G3P2002 at [redacted]w[redacted]d being seen today for ongoing prenatal care.  She is currently monitored for the following issues for this low-risk pregnancy and has Supervision of other normal pregnancy, antepartum; Transfer of OB care from [place] at 31 wks; and Gastroesophageal reflux disease on their problem list.  Patient reports no complaints.  Contractions: Not present. Vag. Bleeding: None.  Movement: Present. Denies leaking of fluid/ROM.   The following portions of the patient's history were reviewed and updated as appropriate: allergies, current medications, past family history, past medical history, past social history, past surgical history and problem list. Problem list updated.  Objective:   Vitals:   01/02/20 1018  BP: 106/67  Pulse: 86  Temp: (!) 97.1 F (36.2 C)  Weight: 176 lb 3.2 oz (79.9 kg)    Fetal Status: Fetal Heart Rate (bpm): 155 Fundal Height: 37 cm Movement: Present  Presentation: Vertex  General:  Alert, oriented and cooperative. Patient is in no acute distress.  Skin: Skin is warm and dry. No rash noted.   Cardiovascular: Normal heart rate noted  Respiratory: Normal respiratory effort, no problems with respiration noted  Abdomen: Soft, gravid, appropriate for gestational age.  Pain/Pressure: Absent     Pelvic: Cervical exam deferred        Extremities: Normal range of motion.  Edema: Trace  Mental Status: Normal mood and affect. Normal behavior. Normal judgment and thought content.   Assessment and Plan:  Pregnancy: G3P2002 at [redacted]w[redacted]d   1. Supervision of other normal pregnancy, antepartum -Possibly interested in doula services, handout given. -IOL form to be completed at next visit. -Reviewed how to get in touch with hospital, discussed when to call.  -Reviewed contraceptive plans and patient still desires mirena maybe.   2. Transfer of OB care from [place] at 31 wks   Term labor symptoms and  general obstetric precautions including but not limited to vaginal bleeding, contractions, leaking of fluid and fetal movement were reviewed in detail with the patient. Please refer to After Visit Summary for other counseling recommendations.  Return in about 1 week (around 01/09/2020) for routine prenatal care.  Future Appointments  Date Time Provider Department Center  01/09/2020 10:20 AM [facility] PROVIDER [facility] None    Ann Held, PA-C

## 2020-01-02 NOTE — Progress Notes (Signed)
In for MH RV at 38.1 weeks. Take PNV daily, denies hospital/ ED visit since last revisit. Sharlyne Pacas, RN

## 2020-01-06 ENCOUNTER — Observation Stay
Admission: EM | Admit: 2020-01-06 | Discharge: 2020-01-06 | Disposition: A | Discharge status: Home / Self Care | Payer: Medicaid Other | Source: Home / Self Care | Admitting: Obstetrics and Gynecology

## 2020-01-06 ENCOUNTER — Encounter: Payer: Self-pay | Admitting: Obstetrics and Gynecology

## 2020-01-06 ENCOUNTER — Telehealth: Payer: Self-pay | Admitting: Family Medicine

## 2020-01-06 ENCOUNTER — Other Ambulatory Visit: Payer: Self-pay

## 2020-01-06 DIAGNOSIS — O3463 Maternal care for abnormality of vagina, third trimester: Secondary | ICD-10-CM | POA: Insufficient documentation

## 2020-01-06 DIAGNOSIS — Z3A38 38 weeks gestation of pregnancy: Secondary | ICD-10-CM | POA: Insufficient documentation

## 2020-01-06 DIAGNOSIS — O4290 Premature rupture of membranes, unspecified as to length of time between rupture and onset of labor, unspecified weeks of gestation: Secondary | ICD-10-CM | POA: Diagnosis present

## 2020-01-06 DIAGNOSIS — O4292 Full-term premature rupture of membranes, unspecified as to length of time between rupture and onset of labor: Secondary | ICD-10-CM | POA: Insufficient documentation

## 2020-01-06 LAB — RUPTURE OF MEMBRANE (ROM)PLUS: Rom Plus: NEGATIVE

## 2020-01-06 MED ORDER — ACETAMINOPHEN 325 MG PO TABS: 650.0000 mg | ORAL_TABLET | ORAL | Status: DC | PRN

## 2020-01-06 MED ORDER — CALCIUM CARBONATE ANTACID 500 MG PO CHEW: 2.0000 | CHEWABLE_TABLET | ORAL | Status: DC | PRN

## 2020-01-06 NOTE — Discharge Summary (Signed)
Meghan Reynolds is a 29 y.o. female. She is at [redacted]w[redacted]d gestation. Patient's last menstrual period was 03/29/2019 (approximate). Estimated Date of Delivery: 01/14/20  Prenatal care site: [facility]  Chief complaint: reports vaginal discharge and leakage of fluid that started today   Meghan Reynolds presents to L&D today with complaints of LOF, vaginal discharge, and irregular contractions.  She reports loosing her mucus plug this weekend.  Vaginal discharge changed to mucus-like discharge.  She then noticed around 3am that her underwear was damp.  Denies gush of fluid.   S: Resting comfortably, no vaginal bleeding.  Reports irregular contractions.Active fetal movement.   Maternal Medical History:  Past Medical Hx:  has a past medical history of Anemia, Gastroesophageal reflux disease (11/14/2019), and Vaginal Pap smear, abnormal.    Past Surgical Hx: No pertinent history    No Known Allergies   Prior to Admission medications   Medication Sig Start Date End Date Taking? Authorizing Provider  Prenatal Vit-Fe Fumarate-FA (PRENATAL VITAMIN) 27-0.8 MG TABS Take 1 tablet by mouth daily. 11/28/19   Federico Flake, MD    Social History: She  reports that she has never smoked. She has never used smokeless tobacco. She reports that she does not drink alcohol and does not use drugs.  Family History: family history includes Heart disease in her father; Hypertension in her father. ,no history of gyn cancers  Review of Systems: A full review of systems was performed and negative except as noted in the HPI.    O:  BP 105/65 (BP Location: Left Arm)    Pulse 91    Temp 97.7 F (36.5 C) (Oral)    Resp 16    LMP 03/29/2019 (Approximate)  Results for orders placed or performed during the hospital encounter of 01/06/20 (from the past 48 hour(s))  ROM Plus ([facility] only)   Collection Time: 01/06/20 11:49 AM  Result Value Ref Range   Rom Plus NEGATIVE      Constitutional: NAD, AAOx3  HE/ENT: extraocular movements  grossly intact, moist mucous membranes CV: RRR PULM: nl respiratory effort, CTABL     Abd: gravid, non-tender, non-distended, soft      Ext: Non-tender, Nonedmeatous   Psych: mood appropriate, speech normal Pelvic : moderate amount of physiologic discharge SVE: Dilation: 2.5 Effacement (%): 70 Cervical Position: Posterior Station: 0 Presentation: Vertex Exam by:: Meghan Reynolds CNM   Fetal Monitor: Baseline: 145bpm Variability: moderate Accels: >2 15x15 present Decels: absent  Toco: irregular, mild contractions   Category: 1   Assessment: 29 y.o. [redacted]w[redacted]d here for antenatal surveillance during pregnancy.  Principle diagnosis: Early labor, intact membranes   Plan:  Labor: not present.   Fetal Wellbeing: Reassuring Cat 1 tracing.  Reactive NST   Rom + neg, no pooling noted.    Reviewed early labor precautions and when to return to L&D   D/c home stable, precautions reviewed, follow-up as scheduled.   ----- Margaretmary Eddy, CNM Certified Nurse Midwife [facility]

## 2020-01-06 NOTE — Telephone Encounter (Signed)
EGA = 38 6/7 (not 39 6/7 as previously documented). Evaluated in L & D, intact membranes, not in active labor, discharged to home. Jossie Ng, RN

## 2020-01-06 NOTE — Telephone Encounter (Signed)
EGA = 39 6/7. Per client, has been having intermittent contractions but none lasting very long or coming every 5 minutes. Reports lost mucus plug last night. Client states since about 3 am today, panties have been continuously damp. Denies gush of fluid from vagina and reports positive fetal movement. Encouraged  L & D evaluation and E. Sciiora CNM agreeable with plan. Jossie Ng, RN

## 2020-01-06 NOTE — Telephone Encounter (Signed)
Patient states she has been having contractions since yesterday and believes her water broke. Wants to know if she can come in.

## 2020-01-07 ENCOUNTER — Encounter: Payer: Self-pay | Admitting: Obstetrics and Gynecology

## 2020-01-07 ENCOUNTER — Inpatient Hospital Stay
Admission: EM | Admit: 2020-01-07 | Discharge: 2020-01-08 | DRG: 806 | Disposition: A | Discharge status: Home / Self Care | Payer: Medicaid Other | Attending: Obstetrics and Gynecology | Admitting: Obstetrics and Gynecology

## 2020-01-07 ENCOUNTER — Other Ambulatory Visit: Payer: Self-pay

## 2020-01-07 DIAGNOSIS — Z3A39 39 weeks gestation of pregnancy: Secondary | ICD-10-CM

## 2020-01-07 DIAGNOSIS — D62 Acute posthemorrhagic anemia: Secondary | ICD-10-CM | POA: Diagnosis not present

## 2020-01-07 DIAGNOSIS — Z20822 Contact with and (suspected) exposure to covid-19: Secondary | ICD-10-CM | POA: Diagnosis present

## 2020-01-07 DIAGNOSIS — O9081 Anemia of the puerperium: Secondary | ICD-10-CM | POA: Diagnosis not present

## 2020-01-07 DIAGNOSIS — O26893 Other specified pregnancy related conditions, third trimester: Secondary | ICD-10-CM | POA: Diagnosis present

## 2020-01-07 DIAGNOSIS — Z3480 Encounter for supervision of other normal pregnancy, unspecified trimester: Secondary | ICD-10-CM | POA: Diagnosis not present

## 2020-01-07 DIAGNOSIS — Z3493 Encounter for supervision of normal pregnancy, unspecified, third trimester: Secondary | ICD-10-CM

## 2020-01-07 LAB — CBC
HCT: 35.6 % — ABNORMAL LOW (ref 36.0–46.0)
Hemoglobin: 12.1 g/dL (ref 12.0–15.0)
MCH: 28.9 pg (ref 26.0–34.0)
MCHC: 34 g/dL (ref 30.0–36.0)
MCV: 85.2 fL (ref 80.0–100.0)
Platelets: 276 10*3/uL (ref 150–400)
RBC: 4.18 MIL/uL (ref 3.87–5.11)
RDW: 14.1 % (ref 11.5–15.5)
WBC: 11.4 10*3/uL — ABNORMAL HIGH (ref 4.0–10.5)
nRBC: 0 % (ref 0.0–0.2)

## 2020-01-07 LAB — ABO/RH: ABO/RH(D): B POS

## 2020-01-07 LAB — TYPE AND SCREEN
ABO/RH(D): B POS
Antibody Screen: NEGATIVE

## 2020-01-07 LAB — SARS CORONAVIRUS 2 BY RT PCR (HOSPITAL ORDER, PERFORMED IN ~~LOC~~ HOSPITAL LAB): SARS Coronavirus 2: NEGATIVE

## 2020-01-07 MED ORDER — FLEET ENEMA 7-19 GM/118ML RE ENEM: 1.0000 | ENEMA | Freq: Every day | RECTAL | Status: DC | PRN

## 2020-01-07 MED ORDER — MEASLES, MUMPS & RUBELLA VAC IJ SOLR
0.5000 mL | Freq: Once | INTRAMUSCULAR | Status: DC
  Filled 2020-01-07: qty 0.5

## 2020-01-07 MED ORDER — ONDANSETRON HCL 4 MG PO TABS: 4.0000 mg | ORAL_TABLET | ORAL | Status: DC | PRN

## 2020-01-07 MED ORDER — OXYTOCIN-SODIUM CHLORIDE 30-0.9 UT/500ML-% IV SOLN
2.5000 [IU]/h | INTRAVENOUS | Status: DC
  Administered 2020-01-07: 2.5 [IU]/h via INTRAVENOUS
  Filled 2020-01-07: qty 500

## 2020-01-07 MED ORDER — WITCH HAZEL-GLYCERIN EX PADS
1.0000 "application " | MEDICATED_PAD | CUTANEOUS | Status: DC | PRN
  Filled 2020-01-07: qty 100

## 2020-01-07 MED ORDER — MISOPROSTOL 200 MCG PO TABS
ORAL_TABLET | ORAL | Status: AC
  Filled 2020-01-07: qty 3

## 2020-01-07 MED ORDER — COCONUT OIL OIL: 1.0000 "application " | TOPICAL_OIL | Status: DC | PRN

## 2020-01-07 MED ORDER — TETANUS-DIPHTH-ACELL PERTUSSIS 5-2.5-18.5 LF-MCG/0.5 IM SUSP: 0.5000 mL | Freq: Once | INTRAMUSCULAR | Status: DC

## 2020-01-07 MED ORDER — SOD CITRATE-CITRIC ACID 500-334 MG/5ML PO SOLN: 30.0000 mL | ORAL | Status: DC | PRN

## 2020-01-07 MED ORDER — ONDANSETRON HCL 4 MG/2ML IJ SOLN: 4.0000 mg | INTRAMUSCULAR | Status: DC | PRN

## 2020-01-07 MED ORDER — ACETAMINOPHEN 325 MG PO TABS
650.0000 mg | ORAL_TABLET | ORAL | Status: DC | PRN
  Administered 2020-01-07: 650 mg via ORAL
  Filled 2020-01-07: qty 2

## 2020-01-07 MED ORDER — SENNOSIDES-DOCUSATE SODIUM 8.6-50 MG PO TABS
2.0000 | ORAL_TABLET | ORAL | Status: DC
  Administered 2020-01-07: 2 via ORAL
  Filled 2020-01-07: qty 2

## 2020-01-07 MED ORDER — ZOLPIDEM TARTRATE 5 MG PO TABS: 5.0000 mg | ORAL_TABLET | Freq: Every evening | ORAL | Status: DC | PRN

## 2020-01-07 MED ORDER — LACTATED RINGERS IV SOLN: INTRAVENOUS | Status: DC

## 2020-01-07 MED ORDER — AMMONIA AROMATIC IN INHA
RESPIRATORY_TRACT | Status: AC
  Filled 2020-01-07: qty 10

## 2020-01-07 MED ORDER — OXYCODONE HCL 5 MG PO TABS: 5.0000 mg | ORAL_TABLET | ORAL | Status: DC | PRN

## 2020-01-07 MED ORDER — DIBUCAINE (PERIANAL) 1 % EX OINT: 1.0000 "application " | TOPICAL_OINTMENT | CUTANEOUS | Status: DC | PRN

## 2020-01-07 MED ORDER — DIPHENHYDRAMINE HCL 25 MG PO CAPS: 25.0000 mg | ORAL_CAPSULE | Freq: Four times a day (QID) | ORAL | Status: DC | PRN

## 2020-01-07 MED ORDER — OXYTOCIN BOLUS FROM INFUSION
333.0000 mL | Freq: Once | INTRAVENOUS | Status: AC
  Administered 2020-01-07: 333 mL via INTRAVENOUS

## 2020-01-07 MED ORDER — LIDOCAINE HCL (PF) 1 % IJ SOLN
30.0000 mL | INTRAMUSCULAR | Status: DC | PRN
  Filled 2020-01-07: qty 30

## 2020-01-07 MED ORDER — ONDANSETRON HCL 4 MG/2ML IJ SOLN: 4.0000 mg | Freq: Four times a day (QID) | INTRAMUSCULAR | Status: DC | PRN

## 2020-01-07 MED ORDER — SIMETHICONE 80 MG PO CHEW
80.0000 mg | CHEWABLE_TABLET | ORAL | Status: DC | PRN
  Administered 2020-01-07: 80 mg via ORAL
  Filled 2020-01-07: qty 1

## 2020-01-07 MED ORDER — ACETAMINOPHEN 325 MG PO TABS: 650.0000 mg | ORAL_TABLET | ORAL | Status: DC | PRN

## 2020-01-07 MED ORDER — IBUPROFEN 600 MG PO TABS
600.0000 mg | ORAL_TABLET | Freq: Four times a day (QID) | ORAL | Status: DC
  Administered 2020-01-07 – 2020-01-08 (×5): 600 mg via ORAL
  Filled 2020-01-07 (×5): qty 1

## 2020-01-07 MED ORDER — LACTATED RINGERS IV SOLN: 500.0000 mL | INTRAVENOUS | Status: DC | PRN

## 2020-01-07 MED ORDER — SODIUM CHLORIDE 0.9 % IV SOLN: 250.0000 mL | INTRAVENOUS | Status: DC | PRN

## 2020-01-07 MED ORDER — BISACODYL 10 MG RE SUPP: 10.0000 mg | Freq: Every day | RECTAL | Status: DC | PRN

## 2020-01-07 MED ORDER — PRENATAL MULTIVITAMIN CH
1.0000 | ORAL_TABLET | Freq: Every day | ORAL | Status: DC
  Administered 2020-01-07 – 2020-01-08 (×2): 1 via ORAL
  Filled 2020-01-07 (×2): qty 1

## 2020-01-07 MED ORDER — OXYTOCIN 10 UNIT/ML IJ SOLN
INTRAMUSCULAR | Status: AC
  Filled 2020-01-07: qty 2

## 2020-01-07 MED ORDER — SODIUM CHLORIDE 0.9% FLUSH: 3.0000 mL | INTRAVENOUS | Status: DC | PRN

## 2020-01-07 MED ORDER — BUTORPHANOL TARTRATE 1 MG/ML IJ SOLN
1.0000 mg | INTRAMUSCULAR | Status: DC | PRN
  Filled 2020-01-07: qty 1

## 2020-01-07 MED ORDER — SODIUM CHLORIDE 0.9% FLUSH
3.0000 mL | Freq: Two times a day (BID) | INTRAVENOUS | Status: DC
  Administered 2020-01-07: 3 mL via INTRAVENOUS

## 2020-01-07 MED ORDER — BENZOCAINE-MENTHOL 20-0.5 % EX AERO
1.0000 "application " | INHALATION_SPRAY | CUTANEOUS | Status: DC | PRN
  Filled 2020-01-07: qty 56

## 2020-01-07 NOTE — OB Triage Note (Signed)
[facility] with complaints of contractions. Pt stated that contractions have picked up and are seeming about 5-7 mins a part. Pt states positive FM. Pt denies LOF and vaginal bleeding. Monitors applied and assessing. Initial FHT 135.

## 2020-01-07 NOTE — Lactation Note (Signed)
This note was copied from a baby's chart. Lactation Consultation Note  Patient Name: Meghan Reynolds [id number] Date: 01/07/2020 Reason for consult: Initial assessment;Term  Initial lactation visit. Mom is G3P3, SVD 4hrs ago and plans on breast and bottle feeding. Mom had successful breastfeeding experiences with other children, but desires early bottle introduction as she feels that her middle child (now 7), not accepting a bottle was a negative. LC mentioned option of pump and bottle feeding and mom declined. Mom desires to offer formula via bottle early on so she has the opportunity to leave baby with caregivers if needed. LC educated mom on importance of establishing a plentiful supply first before formula introduction and the impact that formula can have on that process. Encouraged minimal formula exposure if possible and provided guidance on milk supply and demand and ways to introduce the bottle at a later time. Mom receptive to the information at this time. Reviewed briefly newborn stomach size, feeding behaviors in first 24hrs, output expectations, and benefits of skin to skin. Encouraged rest when baby rests and timing of possible cluster feeding overnight. [facility] name and number updated on whiteboard; encouraged to call out with questions or for assistance.  Maternal Data Formula Feeding for Exclusion: No Has patient been taught Hand Expression?: Yes (per mom remembers from other children) Does the patient have breastfeeding experience prior to this delivery?: Yes  Feeding Feeding Type: Breast Fed  LATCH Score Latch: Grasps breast easily, tongue down, lips flanged, rhythmical sucking.  Audible Swallowing: Spontaneous and intermittent  Type of Nipple: Everted at rest and after stimulation  Comfort (Breast/Nipple): Soft / non-tender  Hold (Positioning): No assistance needed to correctly position infant at breast.  LATCH Score: 10  Interventions Interventions: Breast feeding  basics reviewed;Hand express;DEBP  Lactation Tools Discussed/Used     Consult Status Consult Status: PRN    Danford Bad 01/07/2020, 11:19 AM

## 2020-01-07 NOTE — Discharge Summary (Signed)
Obstetrical Discharge Summary  Patient Name: Meghan Reynolds DOB: 24-Apr-1991 MRN: [id number]  Date of Admission: 01/07/2020 Date of Discharge: 01/08/2020  Primary OB:  [facility]   Gestational Age at Delivery: [redacted]w[redacted]d   Antepartum complications:  - late transfer of care at 31 wks  Admitting Diagnosis: active labor Secondary Diagnosis: Patient Active Problem List   Diagnosis Date Noted  . NSVD (normal spontaneous vaginal delivery) 01/08/2020  . Acute blood loss anemia 01/08/2020  . Gastroesophageal reflux disease 11/14/2019    Augmentation: N/A Complications: None Intrapartum complications/course:  NSVD Date of Delivery: 01/07/20 Delivered By: Christeen Douglas Delivery Type: spontaneous vaginal delivery Anesthesia: none  Placenta: Spontaneous Laceration: none Episiotomy: none Newborn Data: Live born female "Mia" Birth Weight:   APGAR: 8, 9  Newborn Delivery   Birth date/time: 01/07/2020 06:25:00 Delivery type: Vaginal, Spontaneous       Brief Hospital Course  Meghan Reynolds is a [id number] who had a SVD on 01/07/20;  for further details of this delivery, please refer to the delivery note.  Patient had an uncomplicated postpartum course.  By time of discharge on PPD#1, her pain was controlled on oral pain medications; she had appropriate lochia and was ambulating, voiding without difficulty and tolerating regular diet.  She was deemed stable for discharge to home.   Discharge Physical Exam:  BP 103/69 (BP Location: Left Arm)   Pulse 74   Temp 98.4 F (36.9 C) (Oral)   Resp 18   LMP 03/29/2019 (Approximate)   SpO2 96%   Breastfeeding Unknown   General: NAD CV: RRR Pulm: CTABL, nl effort ABD: s/nd/nt, fundus firm and below the umbilicus Lochia: moderate DVT Evaluation: LE non-ttp, no evidence of DVT on exam.  Hemoglobin  Date Value Ref Range Status  01/08/2020 10.7 (L) 12.0 - 15.0 g/dL Final  96/22/2979 89.2 11.1 - 15.9 g/dL Final   HCT  Date Value Ref Range Status   01/08/2020 31.7 (L) 36 - 46 % Final   Hematocrit  Date Value Ref Range Status  11/14/2019 35.1 34.0 - 46.6 % Final    Post partum course: uncomplicated Postpartum Procedures: none Edinburgh:  Edinburgh Postnatal Depression Scale Screening Tool 01/08/2020  I have been able to laugh and see the funny side of things. 0  I have looked forward with enjoyment to things. 0  I have blamed myself unnecessarily when things went wrong. 0  I have been anxious or worried for no good reason. 0  I have felt scared or panicky for no good reason. 0  Things have been getting on top of me. 0  I have been so unhappy that I have had difficulty sleeping. 0  I have felt sad or miserable. 0  I have been so unhappy that I have been crying. 0  The thought of harming myself has occurred to me. 0  Edinburgh Postnatal Depression Scale Total 0    Disposition: stable, discharge to home. Baby Feeding: breastmilk and formula Baby Disposition: home with mom  Rh Immune globulin given: n/a Rubella vaccine given: not tested for in prenatal care  Flu vaccine given in AP or PP setting: unknown Tdap vaccine given in AP or PP setting: unknown   Contraception: vasectomy or IUD  Prenatal Labs: Blood type/Rh --/--/B POS Performed at [facility], [address] [place], [place] [address]  [phone])  Antibody screen neg  Rubella   Varicella   RPR NR  HBsAg Neg  HIV NR  GC neg  Chlamydia neg  Genetic  screening   1 hour GTT 91  3 hour GTT n/a  GBS neg     Plan:  Meghan Reynolds was discharged to home in good condition. Follow-up appointment at [facility]  with delivering provider in 6 weeks   Discharge Medications: Allergies as of 01/08/2020   No Known Allergies     Medication List    TAKE these medications   Prenatal Vitamin 27-0.8 MG Tabs Take 1 tablet by mouth daily.        Follow-up Information    [facility]. Schedule an appointment as  soon as possible for a visit in 6 week(s).   Contact information: [address] [place] [address]              Signed: Cyril Mourning 01/08/2020 9:30 AM

## 2020-01-07 NOTE — Lactation Note (Signed)
This note was copied from a baby's chart. Lactation Consultation Note  Patient Name: Girl Shanvi Moyd [id number] Date: 01/07/2020 Reason for consult: Follow-up assessment  Lactation follow-up. Baby active at the breast when [facility] entered. Mom reported all was well, no concerns/questions. Baby positioned well, active, swallows identified. Encouraged continued feedings on cue, and reassured mom assistance was available if needed.  Maternal Data Formula Feeding for Exclusion: No Has patient been taught Hand Expression?: Yes Does the patient have breastfeeding experience prior to this delivery?: Yes  Feeding Feeding Type: Breast Fed  LATCH Score Latch: Grasps breast easily, tongue down, lips flanged, rhythmical sucking.  Audible Swallowing: Spontaneous and intermittent  Type of Nipple: Everted at rest and after stimulation  Comfort (Breast/Nipple): Soft / non-tender  Hold (Positioning): No assistance needed to correctly position infant at breast.  LATCH Score: 10  Interventions Interventions: Breast feeding basics reviewed  Lactation Tools Discussed/Used     Consult Status Consult Status: PRN    Danford Bad 01/07/2020, 3:48 PM

## 2020-01-07 NOTE — H&P (Signed)
OB ADMISSION/ HISTORY & PHYSICAL:  Admission Date: 01/07/2020  3:26 AM  Admit Diagnosis: active labor at term  Meghan Reynolds is a 29 y.o. [id number] presenting for contractions. Prior NSVD x2 without epidural, no complications. On no medications.  Prenatal History: [id number]   EDC : 01/14/2020, by Ultrasound  Prenatal care at [facility] Prenatal course complicated by  -late transfer of care at 31wks from [place] - GERD   Medical / Surgical History :  Past medical history:  Past Medical History:  Diagnosis Date  . Anemia   . Gastroesophageal reflux disease 11/14/2019  . Vaginal Pap smear, abnormal      Past surgical history: History reviewed. No pertinent surgical history.  Family History:  Family History  Problem Relation Age of Onset  . Hypertension Father   . Heart disease Father      Social History:  reports that she has never smoked. She has never used smokeless tobacco. She reports that she does not drink alcohol and does not use drugs.   Allergies: Patient has no known allergies.    Current Medications at time of admission:  Prior to Admission medications   Medication Sig Start Date End Date Taking? Authorizing Provider  Prenatal Vit-Fe Fumarate-FA (PRENATAL VITAMIN) 27-0.8 MG TABS Take 1 tablet by mouth daily. 11/28/19  Yes Federico Flake, MD     Review of Systems: Active FM onset of ctx @ 0200 currently every 3 minutes LOF  / SROM: no bloody show no   Physical Exam:  VS: Blood pressure 103/82, pulse (!) 230, temperature 97.6 F (36.4 C), temperature source Oral, resp. rate 16, last menstrual period 03/29/2019.  General: alert and oriented, appears NAD Heart: RRR Lungs: Clear lung fields Abdomen: Gravid, soft and non-tender, non-distended Extremities: no edema  FHT: 155, moderate variability, +accels, no decels TOCO: q3-4 SVE:  4/70/-1 on arrival   Cephalic by leopolds  Prenatal Labs: Blood type/Rh --/--/PENDING (09/08 0414)  Antibody screen  neg  Rubella unknown  Varicella unknown  RPR NR  HBsAg Neg  HIV NR  GC neg  Chlamydia neg  Genetic screening unknown  1 hour GTT 92  3 hour GTT n/a  GBS neg   No results found.  Assessment: 105w0d weeks gestation 1 stage of labor FHR category 1   Plan:   Admit for active labor Labs pending Epidural if desired Continuous fetal monitoring   1. Fetal Well being  - Fetal Tracing: 1 - Ultrasound:  reviewed, as above - Group B Streptococcus: neg - Presentation: vtx confirmed by Leopolds   2. Routine OB: - Prenatal labs reviewed, but incomplete records. Will continue to look. I can't find her varicella,  Rubella. Will call [facility] when they open - Rh positive  3. Post Partum Planning: - Infant feeding: breast and bottle - Contraception: vasectomy

## 2020-01-08 DIAGNOSIS — D62 Acute posthemorrhagic anemia: Secondary | ICD-10-CM | POA: Diagnosis not present

## 2020-01-08 LAB — CBC
HCT: 31.7 % — ABNORMAL LOW (ref 36.0–46.0)
Hemoglobin: 10.7 g/dL — ABNORMAL LOW (ref 12.0–15.0)
MCH: 28.9 pg (ref 26.0–34.0)
MCHC: 33.8 g/dL (ref 30.0–36.0)
MCV: 85.7 fL (ref 80.0–100.0)
Platelets: 244 10*3/uL (ref 150–400)
RBC: 3.7 MIL/uL — ABNORMAL LOW (ref 3.87–5.11)
RDW: 14.4 % (ref 11.5–15.5)
WBC: 11.6 10*3/uL — ABNORMAL HIGH (ref 4.0–10.5)
nRBC: 0 % (ref 0.0–0.2)

## 2020-01-08 LAB — RPR: RPR Ser Ql: NONREACTIVE

## 2020-01-08 NOTE — Progress Notes (Signed)
Patient discharged home with infant. Discharge instructions given and reviewed with patient. Patient verbalized understanding. Escorted out by auxillary.  

## 2020-01-08 NOTE — Discharge Instructions (Signed)
Postpartum Care After Vaginal Delivery This sheet gives you information about how to care for yourself from the time you deliver your baby to up to 6-12 weeks after delivery (postpartum period). Your health care provider may also give you more specific instructions. If you have problems or questions, contact your health care provider. Follow these instructions at home: Vaginal bleeding  It is normal to have vaginal bleeding (lochia) after delivery. Wear a sanitary pad for vaginal bleeding and discharge. ? During the first week after delivery, the amount and appearance of lochia is often similar to a menstrual period. ? Over the next few weeks, it will gradually decrease to a dry, yellow-brown discharge. ? For most women, lochia stops completely by 4-6 weeks after delivery. Vaginal bleeding can vary from woman to woman.  Change your sanitary pads frequently. Watch for any changes in your flow, such as: ? A sudden increase in volume. ? A change in color. ? Large blood clots.  If you pass a blood clot from your vagina, save it and call your health care provider to discuss. Do not flush blood clots down the toilet before talking with your health care provider.  Do not use tampons or douches until your health care provider says this is safe.  If you are not breastfeeding, your period should return 6-8 weeks after delivery. If you are feeding your child breast milk only (exclusive breastfeeding), your period may not return until you stop breastfeeding. Perineal care  Keep the area between the vagina and the anus (perineum) clean and dry as told by your health care provider. Use medicated pads and pain-relieving sprays and creams as directed.  If you had a cut in the perineum (episiotomy) or a tear in the vagina, check the area for signs of infection until you are healed. Check for: ? More redness, swelling, or pain. ? Fluid or blood coming from the cut or tear. ? Warmth. ? Pus or a bad  smell.  You may be given a squirt bottle to use instead of wiping to clean the perineum area after you go to the bathroom. As you start healing, you may use the squirt bottle before wiping yourself. Make sure to wipe gently.  To relieve pain caused by an episiotomy, a tear in the vagina, or swollen veins in the anus (hemorrhoids), try taking a warm sitz bath 2-3 times a day. A sitz bath is a warm water bath that is taken while you are sitting down. The water should only come up to your hips and should cover your buttocks. Breast care  Within the first few days after delivery, your breasts may feel heavy, full, and uncomfortable (breast engorgement). Milk may also leak from your breasts. Your health care provider can suggest ways to help relieve the discomfort. Breast engorgement should go away within a few days.  If you are breastfeeding: ? Wear a bra that supports your breasts and fits you well. ? Keep your nipples clean and dry. Apply creams and ointments as told by your health care provider. ? You may need to use breast pads to absorb milk that leaks from your breasts. ? You may have uterine contractions every time you breastfeed for up to several weeks after delivery. Uterine contractions help your uterus return to its normal size. ? If you have any problems with breastfeeding, work with your health care provider or lactation consultant.  If you are not breastfeeding: ? Avoid touching your breasts a lot. Doing this can make   your breasts produce more milk. ? Wear a good-fitting bra and use cold packs to help with swelling. ? Do not squeeze out (express) milk. This causes you to make more milk. Intimacy and sexuality  Ask your health care provider when you can engage in sexual activity. This may depend on: ? Your risk of infection. ? How fast you are healing. ? Your comfort and desire to engage in sexual activity.  You are able to get pregnant after delivery, even if you have not had  your period. If desired, talk with your health care provider about methods of birth control (contraception). Medicines  Take over-the-counter and prescription medicines only as told by your health care provider.  If you were prescribed an antibiotic medicine, take it as told by your health care provider. Do not stop taking the antibiotic even if you start to feel better. Activity  Gradually return to your normal activities as told by your health care provider. Ask your health care provider what activities are safe for you.  Rest as much as possible. Try to rest or take a nap while your baby is sleeping. Eating and drinking   Drink enough fluid to keep your urine pale yellow.  Eat high-fiber foods every day. These may help prevent or relieve constipation. High-fiber foods include: ? Whole grain cereals and breads. ? Brown rice. ? Beans. ? Fresh fruits and vegetables.  Do not try to lose weight quickly by cutting back on calories.  Take your prenatal vitamins until your postpartum checkup or until your health care provider tells you it is okay to stop. Lifestyle  Do not use any products that contain nicotine or tobacco, such as cigarettes and e-cigarettes. If you need help quitting, ask your health care provider.  Do not drink alcohol, especially if you are breastfeeding. General instructions  Keep all follow-up visits for you and your baby as told by your health care provider. Most women visit their health care provider for a postpartum checkup within the first 3-6 weeks after delivery. Contact a health care provider if:  You feel unable to cope with the changes that your child brings to your life, and these feelings do not go away.  You feel unusually sad or worried.  Your breasts become red, painful, or hard.  You have a fever.  You have trouble holding urine or keeping urine from leaking.  You have little or no interest in activities you used to enjoy.  You have not  breastfed at all and you have not had a menstrual period for 12 weeks after delivery.  You have stopped breastfeeding and you have not had a menstrual period for 12 weeks after you stopped breastfeeding.  You have questions about caring for yourself or your baby.  You pass a blood clot from your vagina. Get help right away if:  You have chest pain.  You have difficulty breathing.  You have sudden, severe leg pain.  You have severe pain or cramping in your lower abdomen.  You bleed from your vagina so much that you fill more than one sanitary pad in one hour. Bleeding should not be heavier than your heaviest period.  You develop a severe headache.  You faint.  You have blurred vision or spots in your vision.  You have bad-smelling vaginal discharge.  You have thoughts about hurting yourself or your baby. If you ever feel like you may hurt yourself or others, or have thoughts about taking your own life, get help   right away. You can go to the nearest emergency department or call:  Your local emergency services (911 in the [place]  A suicide crisis helpline, such as the National Suicide Prevention Lifeline at 1-800-273-8255. This is open 24 hours a day. Summary  The period of time right after you deliver your newborn up to 6-12 weeks after delivery is called the postpartum period.  Gradually return to your normal activities as told by your health care provider.  Keep all follow-up visits for you and your baby as told by your health care provider. This information is not intended to replace advice given to you by your health care provider. Make sure you discuss any questions you have with your health care provider. Document Revised: 04/20/2017 Document Reviewed: 01/29/2017 Elsevier Patient Education  2020 [employer]  Postpartum Baby Blues The postpartum period begins right after the birth of a baby. During this time, there is often a lot of joy and excitement. It is also a  time of many changes in the life of the parents. No matter how many times a mother gives birth, each child brings new challenges to the family, including different ways of relating to one another. It is common to have feelings of excitement along with confusing changes in moods, emotions, and thoughts. You may feel happy one minute and sad or stressed the next. These feelings of sadness usually happen in the period right after you have your baby, and they go away within a week or two. This is called the "baby blues." What are the causes? There is no known cause of baby blues. It is likely caused by a combination of factors. However, changes in hormone levels after childbirth are believed to trigger some of the symptoms. Other factors that can play a role in these mood changes include:  Lack of sleep.  Stressful life events, such as poverty, caring for a loved one, or death of a loved one.  Genetics. What are the signs or symptoms? Symptoms of this condition include:  Brief changes in mood, such as going from extreme happiness to sadness.  Decreased concentration.  Difficulty sleeping.  Crying spells and tearfulness.  Loss of appetite.  Irritability.  Anxiety. If the symptoms of baby blues last for more than 2 weeks or become more severe, you may have postpartum depression. How is this diagnosed? This condition is diagnosed based on an evaluation of your symptoms. There are no medical or lab tests that lead to a diagnosis, but there are various questionnaires that a health care provider may use to identify women with the baby blues or postpartum depression. How is this treated? Treatment is not needed for this condition. The baby blues usually go away on their own in 1-2 weeks. Social support is often all that is needed. You will be encouraged to get adequate sleep and rest. Follow these instructions at home: Lifestyle      Get as much rest as you can. Take a nap when the baby  sleeps.  Exercise regularly as told by your health care provider. Some women find yoga and walking to be helpful.  Eat a balanced and nourishing diet. This includes plenty of fruits and vegetables, whole grains, and lean proteins.  Do little things that you enjoy. Have a cup of tea, take a bubble bath, read your favorite magazine, or listen to your favorite music.  Avoid alcohol.  Ask for help with household chores, cooking, grocery shopping, or running errands. Do not try to   do everything yourself. Consider hiring a postpartum doula to help. This is a professional who specializes in providing support to new mothers.  Try not to make any major life changes during pregnancy or right after giving birth. This can add stress. General instructions  Talk to people close to you about how you are feeling. Get support from your partner, family members, friends, or other new moms. You may want to join a support group.  Find ways to cope with stress. This may include: ? Writing your thoughts and feelings in a journal. ? Spending time outside. ? Spending time with people who make you laugh.  Try to stay positive in how you think. Think about the things you are grateful for.  Take over-the-counter and prescription medicines only as told by your health care provider.  Let your health care provider know if you have any concerns.  Keep all postpartum visits as told by your health care provider. This is important. Contact a health care provider if:  Your baby blues do not go away after 2 weeks. Get help right away if:  You have thoughts of taking your own life (suicidal thoughts).  You think you may harm the baby or other people.  You see or hear things that are not there (hallucinations). Summary  After giving birth, you may feel happy one minute and sad or stressed the next. Feelings of sadness that happen right after the baby is born and go away after a week or two are called the "baby  blues."  You can manage the baby blues by getting enough rest, eating a healthy diet, exercising, spending time with supportive people, and finding ways to cope with stress.  If feelings of sadness and stress last longer than 2 weeks or get in the way of caring for your baby, talk to your health care provider. This may mean you have postpartum depression. This information is not intended to replace advice given to you by your health care provider. Make sure you discuss any questions you have with your health care provider. Document Revised: 08/09/2018 Document Reviewed: 06/13/2016 Elsevier Patient Education  2020 Elsevier Inc.  

## 2020-01-08 NOTE — Progress Notes (Signed)
Called [facility] requesting prenatal records due to varicella and rubella unknown.

## 2020-01-09 ENCOUNTER — Ambulatory Visit: Payer: Medicaid Other

## 2020-02-06 ENCOUNTER — Ambulatory Visit: Payer: Medicaid Other

## 2020-02-11 ENCOUNTER — Other Ambulatory Visit: Payer: Self-pay

## 2020-02-11 ENCOUNTER — Ambulatory Visit: Payer: Medicaid Other

## 2020-02-11 ENCOUNTER — Encounter: Payer: Self-pay | Admitting: Family Medicine

## 2020-02-11 ENCOUNTER — Ambulatory Visit (LOCAL_COMMUNITY_HEALTH_CENTER): Payer: Medicaid Other | Admitting: Family Medicine

## 2020-02-11 VITALS — BP 101/73 | Ht 66.0 in | Wt 163.0 lb

## 2020-02-11 DIAGNOSIS — Z30430 Encounter for insertion of intrauterine contraceptive device: Secondary | ICD-10-CM | POA: Diagnosis not present

## 2020-02-11 DIAGNOSIS — Z3009 Encounter for other general counseling and advice on contraception: Secondary | ICD-10-CM

## 2020-02-11 LAB — HEMOGLOBIN, FINGERSTICK: Hemoglobin: 14.1 g/dL (ref 11.1–15.9)

## 2020-02-11 MED ORDER — LEVONORGESTREL 20 MCG/24HR IU IUD
1.0000 | INTRAUTERINE_SYSTEM | Freq: Once | INTRAUTERINE | Status: AC
  Administered 2020-02-11: 1 via INTRAUTERINE

## 2020-02-11 NOTE — Progress Notes (Signed)
Pt reports no sex since before her baby was born. RN counseling for Mirena IUD completed and consent forms reviewed and signed by pt and consult completed. Hgb 14.1 and pt counseled per Larene Pickett, FNP verbal order for pt to continue with Prenatal vitamins at this time. Provider orders completed.

## 2020-02-11 NOTE — Progress Notes (Signed)
Family Planning Visit- Repeat Yearly Visit  Subjective:  Meghan Reynolds is a 29 y.o. G3P3003  being seen today for an well woman visit and to discuss family planning options.    She is currently using Client delivered on 01/07/2020 and hasn't been sexually active since delivery.  Patient reports she does not or her partner wants a pregnancy in the next year. Patient  has Gastroesophageal reflux disease; NSVD (normal spontaneous vaginal delivery); and Acute blood loss anemia on their problem list.  No chief complaint on file.   Patient reports she is here for her pp exam and Mirena IUD insertion.  She is not having bleeding or spotting since delivery.  Client is breastfeeding her infant.  She has offered her formula a few times when she was not available to breastfeed her.  She states that she doesn't feel depressed or sad. She isn't taking PNV vitamins or FESO4.  Patient denies any concerns  See flowsheet for other program required questions.   There is no height or weight on file to calculate BMI. - Patient is eligible for diabetes screening based on BMI and age >23?  not applicable HA1C ordered? not applicable  Patient reports 1 of partners in last year. Desires STI screening?  Yes   Has patient been screened once for HCV in the past?  No  No results found for: HCVAB  Does the patient have current of drug use, have a partner with drug use, and/or has been incarcerated since last result? No  If yes-- Screen for HCV through [facility]   Does the patient meet criteria for HBV testing? No  Criteria:  -Household, sexual or needle sharing contact with HBV -History of drug use -HIV positive -Those with known Hep C   Health Maintenance Due  Topic Date Due  . COVID-19 Vaccine (1) Never done  . TETANUS/TDAP  Never done  . INFLUENZA VACCINE  Never done    ROS  The following portions of the patient's history were reviewed and updated as appropriate: allergies, current medications,  past family history, past medical history, past social history, past surgical history and problem list. Problem list updated.  Objective:  There were no vitals filed for this visit.  Physical Exam Vitals and nursing note reviewed.  Constitutional:      Appearance: Normal appearance.  HENT:     Head: Normocephalic.  Pulmonary:     Effort: Pulmonary effort is normal.  Genitourinary:    General: Normal vulva.     Labia:        Right: No rash, tenderness, lesion or injury.        Left: No rash, tenderness, lesion or injury.      Vagina: Normal.     Cervix: Normal.     Uterus: Normal.      Adnexa: Right adnexa normal and left adnexa normal.     Rectum: Normal.  Musculoskeletal:        General: Normal range of motion.  Lymphadenopathy:     Lower Body: No right inguinal adenopathy. No left inguinal adenopathy.  Skin:    General: Skin is warm and dry.  Neurological:     Mental Status: She is alert. Mental status is at baseline.    Assessment and Plan:  Meghan Reynolds is a 29 y.o. female [phone] presenting to the [facility] Department for an yearly well woman exam/family planning visit  Contraception counseling: Reviewed all forms of birth control options in the tiered based approach.  available including abstinence; over the counter/barrier methods; hormonal contraceptive medication including pill, patch, ring, injection,contraceptive implant, ECP; hormonal and nonhormonal IUDs; permanent sterilization options including vasectomy and the various tubal sterilization modalities. Risks, benefits, and typical effectiveness rates were reviewed.  Questions were answered.  Written information was also given to the patient to review.  Patient desires MIrena IUD, this was prescribed for patient. She will follow up in 1 month for string check if needed or 1 year for surveillance.  She was told to call with any further questions, or with any concerns about this method of contraception.   Emphasized use of condoms 100% of the time for STI prevention.  Patient was not an ECP candidate.   1. Family planning  - Hemoglobin, venipuncture-14.1  2. Encounter for IUD insertion Client counseled to use condoms x 1 week for back-up after IUD insertion. - levonorgestrel (MIRENA) 20 MCG/24HR IUD 1 each  Patient presented to [facility] for IUD insertion. Her GC/CT screening was found to be up to date and using WHO criteria we can be reasonably certain she is not pregnant.  See Flowsheet for IUD check list  IUD Insertion Procedure Note Patient identified, informed consent performed, consent signed.   Discussed risks of irregular bleeding, cramping, infection, malpositioning or misplacement of the IUD outside the uterus which may require further procedure such as laparoscopy. Time out was performed.    Speculum placed in the vagina.  Cervix visualized.  Cleaned with Betadine x 2.  Grasped anteriorly with a single tooth tenaculum.  Uterus sounded to 9 cm.  IUD placed per manufacturer's recommendations.  Strings trimmed to 3 cm.  Tenaculum was removed, good hemostasis noted.  Patient tolerated procedure well.   Patient was given post-procedure instructions- both agency handout and verbally by provider.  She was advised to have backup contraception for one week.  Patient was also asked to check IUD strings periodically or follow up in 4 weeks for IUD check.    Return in about 1 year (around 02/10/2021).  No future appointments.  Larene Pickett, FNP

## 2020-03-16 NOTE — Addendum Note (Signed)
Addended by: Heywood Bene on: 03/16/2020 10:59 AM   Modules accepted: Orders

## 2021-10-02 IMAGING — US US OB COMP +14 WK
1 series · 13 of 28 positions shown · non-contrast
Comparison: none

CLINICAL DATA: Pregnancy.  Assess fetal anatomy.

EXAM:
OBSTETRICAL ULTRASOUND >14 WKS

[Series 1: us ob comp + 14 wk · 13 of 96 slices shown]
[im 4/96]
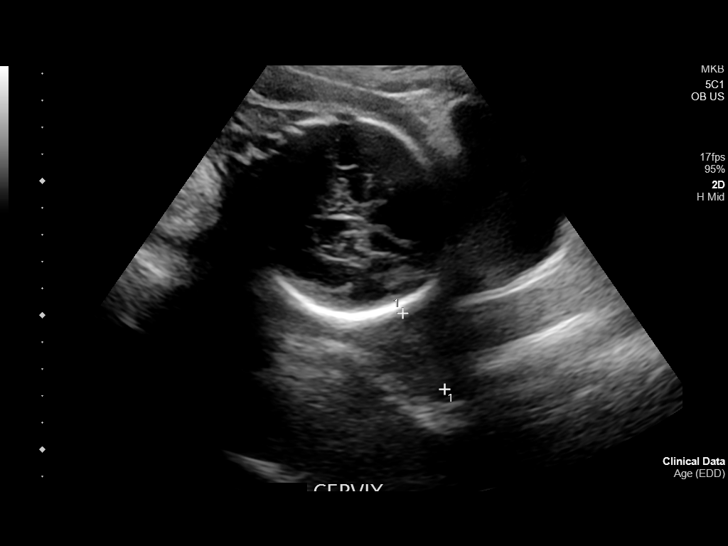
[im 11/96]
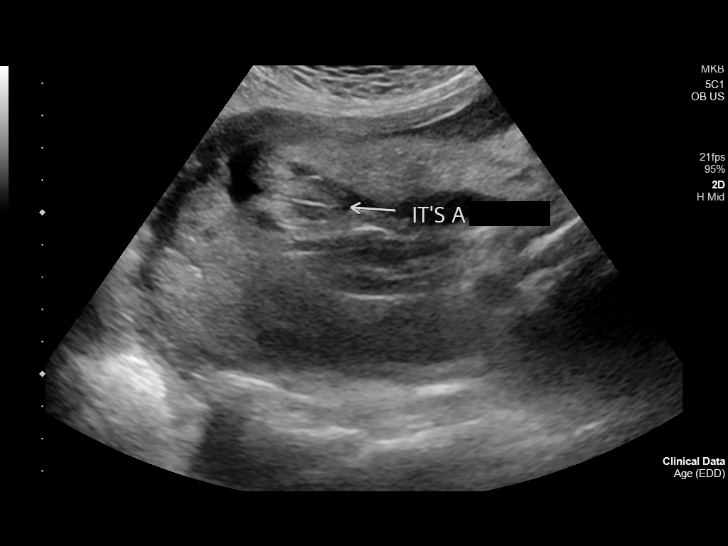
[im 18/96]
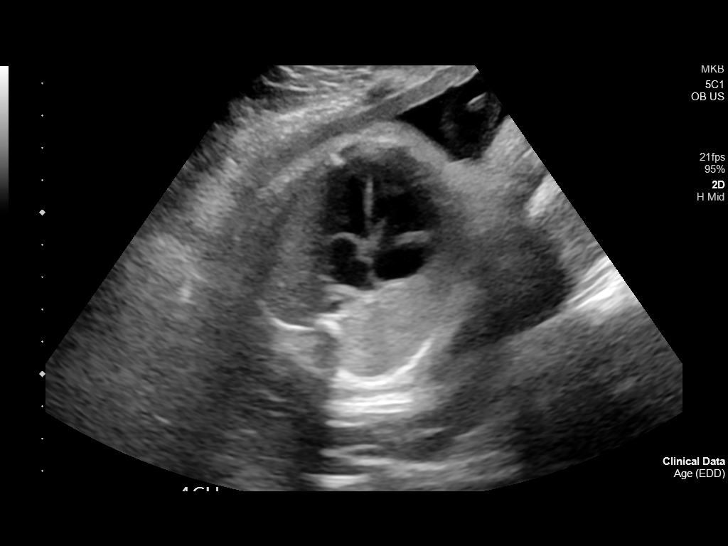
[im 25/96]
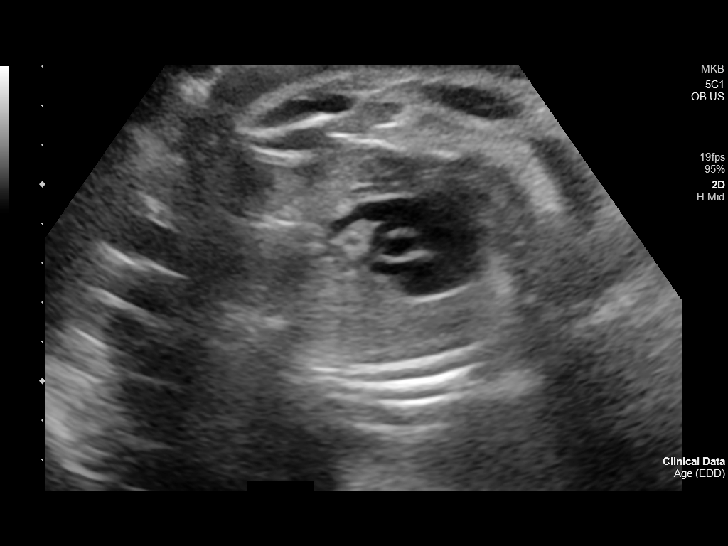
[im 32/96]
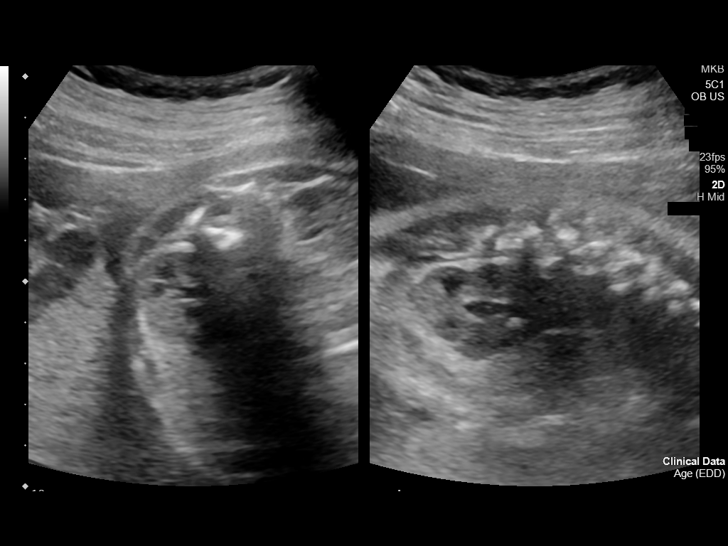
[im 39/96]
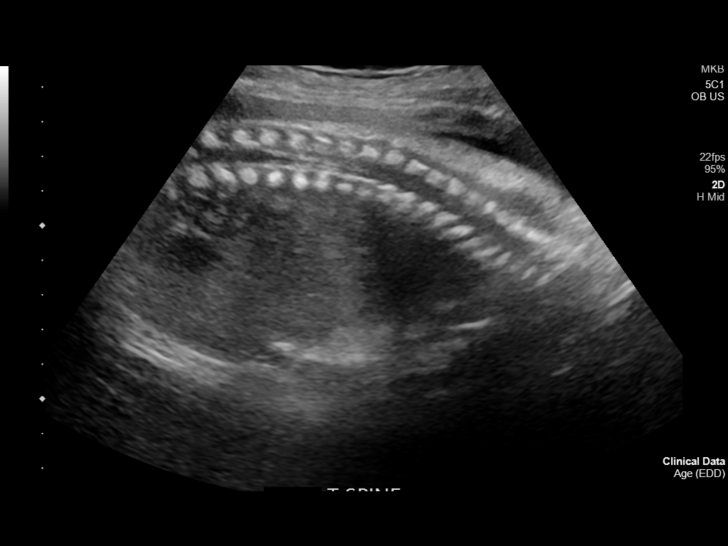
[im 50/96]
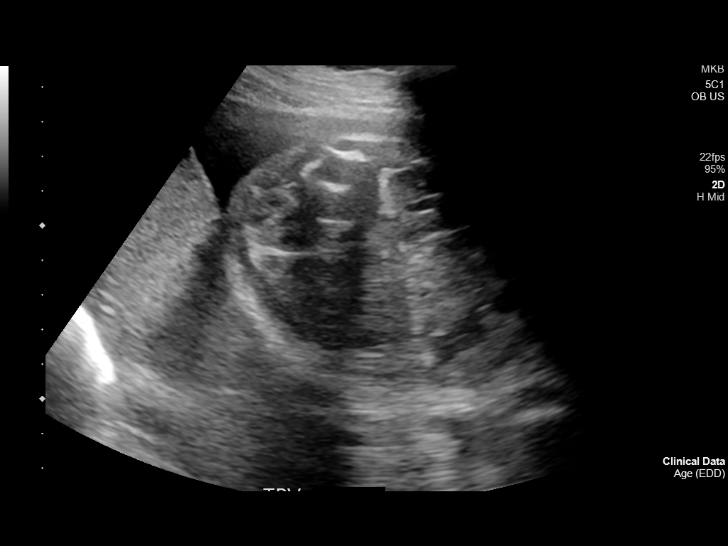
[im 57/96]
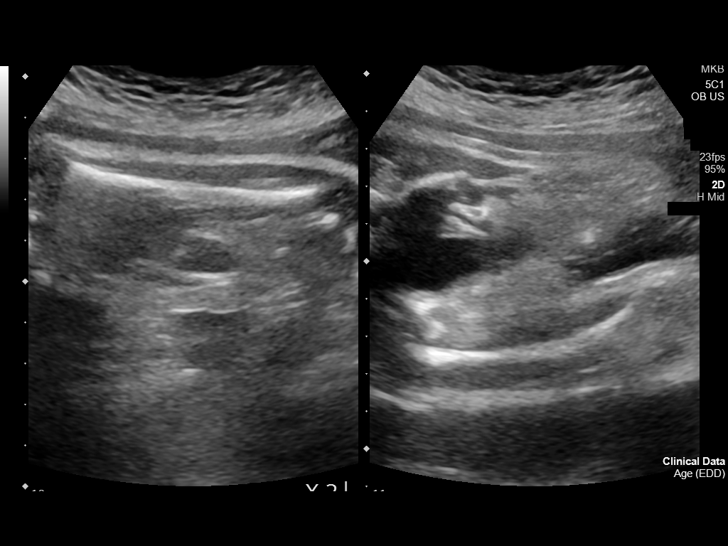
[im 64/96]
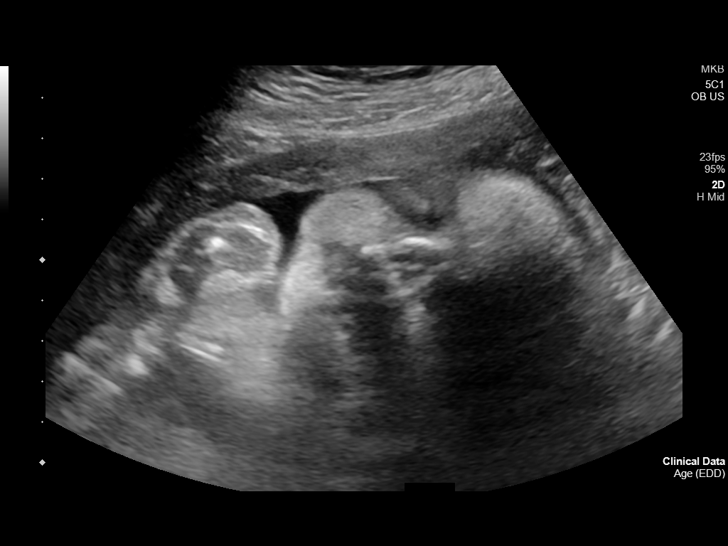
[im 71/96]
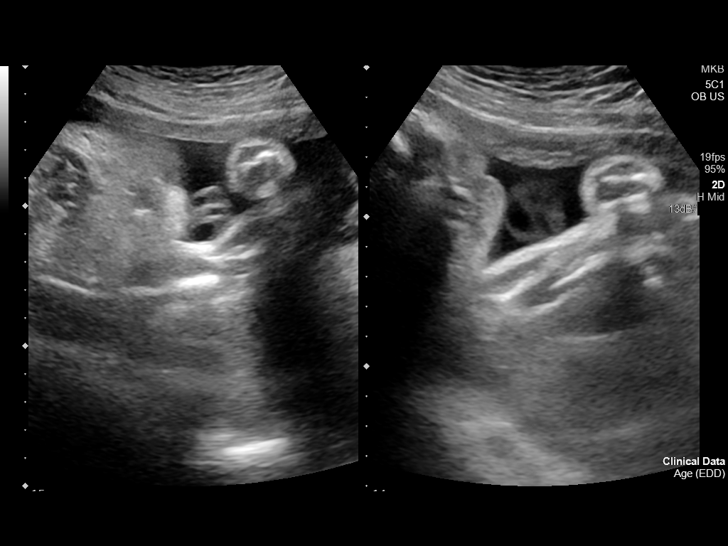
[im 78/96]
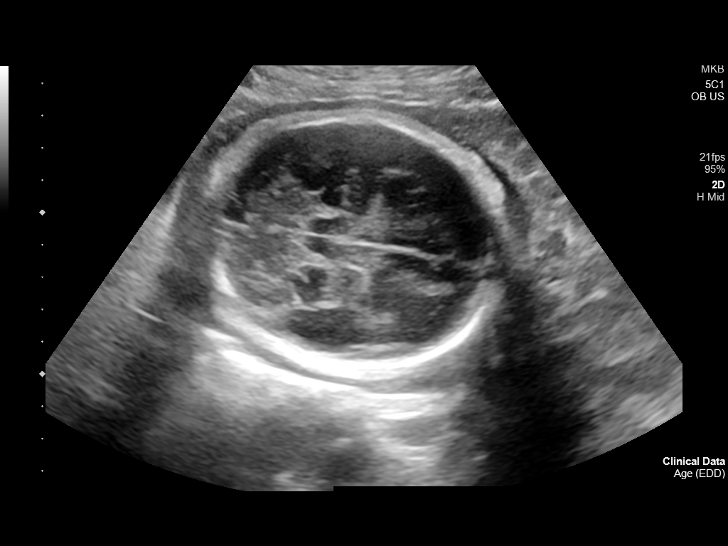
[im 85/96]
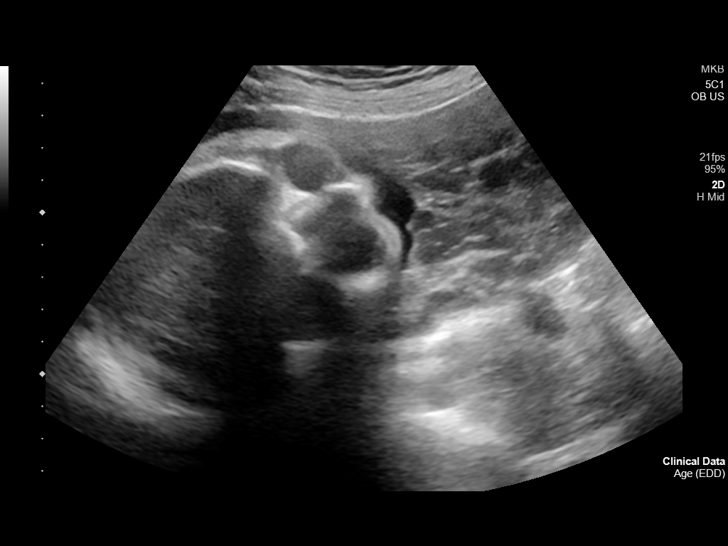
[im 92/96]
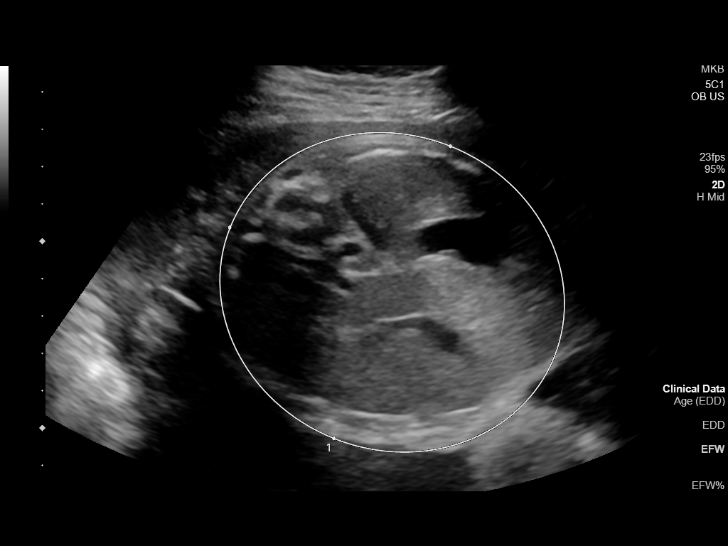

[13 of 28 positions shown; findings below may reference images not displayed]

FINDINGS: Number of Fetuses: 1

Heart Rate:  150 bpm

Movement: Yes

Presentation: Cephalic

Previa: No

Placental Location: Posterior

Amniotic Fluid (Subjective): Normal

Amniotic Fluid (Objective):

AFI = 11.7 cm (5%ile= 8.6 cm, 95%= 24.2 cm for 32 wks)

FETAL BIOMETRY

BPD: 8.0cm 32w 1d

HC:   30.0cm 33w 1d

AC:   27.3cm 31w 3d

FL:   6.3cm 32w 4d

Current Mean GA: 32w 3d US EDC: 01/13/2020

Assigned GA:  32w 2d Assigned EDC: 01/14/2020

Estimated Fetal Weight:  1,880g 30%ile

FETAL ANATOMY

Lateral Ventricles: Appears normal

Thalami/CSP: Appears normal

Posterior Fossa:  Appears normal

Nuchal Region: Appears normal   NFT= N/A > 20 WKS

Upper Lip: Appears normal

Spine: Appears normal

4 Chamber Heart on Left: Appears normal

LVOT: Appears normal

RVOT: Appears normal

Stomach on Left: Appears normal

3 Vessel Cord: Appears normal

Cord Insertion site: Not well visualized

Kidneys: Appears normal

Bladder: Appears normal

Extremities: 4 extremities visualized. Limited visualization of the
hands and feet.

Technically difficult due to: Advanced gestational age

Maternal Findings:

Cervix:  3.2 cm
IMPRESSION: 1. Single live intrauterine gestation in cephalic presentation.
Reported assigned gestational age of 32 weeks 2 days.
2. No fetal anatomic anomaly is identified. Limited visualization of
the cord insertion site as well as the fetal hands and feet
secondary to advanced gestational age.
3. Estimated fetal weight is in the 30th percentile.
4. Amniotic fluid index of 11.7 cm, within normal limits.

## 2023-08-10 ENCOUNTER — Encounter: Payer: Self-pay | Admitting: Family Medicine

## 2023-08-10 ENCOUNTER — Ambulatory Visit: Admitting: Family Medicine

## 2023-08-10 VITALS — BP 99/66 | HR 99 | Ht 66.0 in | Wt 156.6 lb

## 2023-08-10 DIAGNOSIS — Z3009 Encounter for other general counseling and advice on contraception: Secondary | ICD-10-CM

## 2023-08-10 DIAGNOSIS — Z113 Encounter for screening for infections with a predominantly sexual mode of transmission: Secondary | ICD-10-CM

## 2023-08-10 DIAGNOSIS — Z01419 Encounter for gynecological examination (general) (routine) without abnormal findings: Secondary | ICD-10-CM

## 2023-08-10 DIAGNOSIS — Z309 Encounter for contraceptive management, unspecified: Secondary | ICD-10-CM | POA: Diagnosis not present

## 2023-08-10 LAB — HM HIV SCREENING LAB: HM HIV Screening: NEGATIVE

## 2023-08-10 LAB — WET PREP FOR TRICH, YEAST, CLUE
Trichomonas Exam: NEGATIVE
Yeast Exam: NEGATIVE

## 2023-08-10 NOTE — Progress Notes (Signed)
 [employer] [facility] DEPARTMENT [facility] [address] [place] [address] Main phone: [phone]  Family Planning Visit - Repeat Yearly Visit  Subjective:  Meghan Reynolds is a 33 y.o. G3P3003  being seen today for an annual wellness visit and to discuss contraception options. The patient is currently using IUD (or IUS) for pregnancy prevention. Patient does not want a pregnancy in the next year.   Patient reports they are looking for a method with the following characteristics:  High efficacy at preventing pregnancy  Patient has the following medical problems:  Patient Active Problem List   Diagnosis Date Noted   NSVD (normal spontaneous vaginal delivery) 01/08/2020   Acute blood loss anemia 01/08/2020   Gastroesophageal reflux disease 11/14/2019    Chief Complaint  Patient presents with   Annual Exam    HPI Patient reports to clinic for PE and pap smear with STI testing. Denies any vaginal symptoms. Reports a darkening of the skin that is present on her right inner eyelid, near the bridge of her nose. States this has been there > 2 years- denies itching, discharge from eye, vision changes, or trauma. Reports she went to an eye doctor- who said her eyes were fine and told her to go to derm   Review of Systems  Constitutional:  Negative for weight loss.  Eyes:  Negative for blurred vision.  Respiratory:  Negative for cough and shortness of breath.   Cardiovascular:  Negative for claudication.  Gastrointestinal:  Negative for nausea.  Genitourinary:  Negative for dysuria and frequency.  Skin:  Negative for rash.  Neurological:  Negative for headaches.  Endo/Heme/Allergies:  Does not bruise/bleed easily.    See flowsheet for other program required questions.   Diabetes screening This patient is 33 y.o. with a BMI of Body mass index is 25.28 kg/m.Marland Kitchen  Is patient eligible for diabetes screening (age >35 and BMI >25)?  no  Was Hgb  A1c ordered? not applicable  STI screening Patient reports 1 of partners in last year.  Does this patient desire STI screening?  Yes  Hepatitis C screening Has patient been screened once for HCV in the past?  No  No results found for: "HCVAB"  Does the patient meet criteria for HCV testing? No  (If yes-- Screen for HCV through [facility]) Criteria:  Since the last HCV result, does the patient have any of the following? - Current drug use - Have a partner with drug use - Has been incarcerated  Hepatitis B screening Does the patient meet criteria for HBV testing? No Criteria:  -Household, sexual or needle sharing contact with HBV -History of drug use -HIV positive -Those with known Hep C  Cervical Cancer Screening  Result Date Procedure Results Follow-ups  11/14/2019 IGP, rfx Aptima HPV ASCU DIAGNOSIS:: Comment Specimen adequacy:: Comment Clinician Provided ICD10: Comment Performed by:: Comment PAP Smear Comment: . Note:: Comment Test Methodology: Comment PAP Reflex: Comment     Health Maintenance Due  Topic Date Due   DTaP/Tdap/Td (1 - Tdap) Never done   Cervical Cancer Screening (HPV/Pap Cotest)  11/14/2022   COVID-19 Vaccine (1 - 2024-25 season) Never done    The following portions of the patient's history were reviewed and updated as appropriate: allergies, current medications, past family history, past medical history, past social history, past surgical history and problem list. Problem list updated.  Objective:   Vitals:   08/10/23 1358  BP: 99/66  Pulse: 99  Weight: 156  lb 9.6 oz (71 kg)  Height: 5\' 6"  (1.676 m)    Physical Exam Vitals and nursing note reviewed. Exam conducted with a chaperone present (Dr. Larita Fife).  Constitutional:      Appearance: Normal appearance.  HENT:     Head: Normocephalic and atraumatic.     Mouth/Throat:     Mouth: Mucous membranes are moist.     Pharynx: Oropharynx is clear. No oropharyngeal exudate or posterior  oropharyngeal erythema.  Eyes:      Comments: Purple/brown discoloration of skin on the right inner eyelid   Pulmonary:     Effort: Pulmonary effort is normal.  Abdominal:     General: Abdomen is flat.     Palpations: There is no mass.     Tenderness: There is no abdominal tenderness. There is no rebound.  Genitourinary:    General: Normal vulva.     Exam position: Lithotomy position.     Pubic Area: No rash or pubic lice.      Labia:        Right: No rash or lesion.        Left: No rash or lesion.      Vagina: Normal. No vaginal discharge, erythema, bleeding or lesions.     Cervix: No cervical motion tenderness, discharge, friability, lesion or erythema.     Uterus: Normal.      Adnexa: Right adnexa normal and left adnexa normal.     Rectum: Normal.     Comments: pH = 4  IUD strings visualized coming from cervical os Lymphadenopathy:     Head:     Right side of head: No preauricular or posterior auricular adenopathy.     Left side of head: No preauricular or posterior auricular adenopathy.     Cervical: No cervical adenopathy.     Upper Body:     Right upper body: No supraclavicular, axillary or epitrochlear adenopathy.     Left upper body: No supraclavicular, axillary or epitrochlear adenopathy.     Lower Body: No right inguinal adenopathy. No left inguinal adenopathy.  Skin:    General: Skin is warm and dry.     Findings: No rash.  Neurological:     Mental Status: She is alert and oriented to person, place, and time.     Assessment and Plan:  Meghan Reynolds is a 33 y.o. female G3P3003 presenting to the [facility] Department for an yearly wellness and contraception visit 1. Family planning (Primary) Contraception counseling: Reviewed options based on patient desire and reproductive life plan. Patient is interested in IUD or IUS. This was not provided to the patient today. IUD already in place  Risks, benefits, and typical effectiveness rates were reviewed.   Questions were answered.  Written information was also given to the patient to review.    The patient will follow up in  1 years for surveillance.  The patient was told to call with any further questions, or with any concerns about this method of contraception.  Emphasized use of condoms 100% of the time for STI prevention.  Educated on ECP and assessed need for ECP. Not indicated- pt currently has IUD in place   2. Well woman exam with routine gynecological exam -CBE deferred today due to time -pap smear repeated- pt reports not hx of abnormal pap, last done in 2021, NILM per chart review  -counseled that they skin color change near her eye could be a result of pigmentation changes that have occurred due to  allergies, pregnancy or something else -does not appear to have any concerning symptoms -reviewed that she could try dermatology- however she reports this is not a problem that is really bothering her and I reviewed that sometimes derm appts can have a long wait list -per Dr. Larita Fife recommendation- may try allergy medicine like Zyrtec or Claritin x 3-4 weeks to see if this improves -otherwise may consult a PCP for derm referral if desired   - IGP, Aptima HPV - WET PREP FOR TRICH, YEAST, CLUE  3. Screening for venereal disease  - Chlamydia/Gonorrhea [facility] - HIV [facility] - Syphilis Serology, [facility]   Return in about 1 year (around 08/09/2024) for annual well-woman exam.  No future appointments.  Lenice Llamas, [place]

## 2023-08-10 NOTE — Progress Notes (Signed)
Pt is here for family planning visit.  Family planning packet reviewed and given to pt.  Wet prep results reviewed, no treatment required per standing orders. Condoms declined. Gaspar Garbe, RN

## 2023-08-21 LAB — IGP, APTIMA HPV
HPV Aptima: NEGATIVE
PAP Smear Comment: 0

## 2023-09-11 ENCOUNTER — Ambulatory Visit: Payer: Self-pay
